# Patient Record
Sex: Male | Born: 1995 | Race: White | Hispanic: No | Marital: Married | Smoking: Former smoker
Health system: Southern US, Community
[De-identification: ages and names within clinical notes are randomized; demographics above are authoritative.]

## PROBLEM LIST (undated history)

## (undated) DIAGNOSIS — R079 Chest pain, unspecified: Secondary | ICD-10-CM

## (undated) DIAGNOSIS — E119 Type 2 diabetes mellitus without complications: Secondary | ICD-10-CM

## (undated) DIAGNOSIS — R61 Generalized hyperhidrosis: Secondary | ICD-10-CM

## (undated) DIAGNOSIS — R0602 Shortness of breath: Secondary | ICD-10-CM

## (undated) DIAGNOSIS — F419 Anxiety disorder, unspecified: Secondary | ICD-10-CM

## (undated) DIAGNOSIS — F17200 Nicotine dependence, unspecified, uncomplicated: Secondary | ICD-10-CM

## (undated) DIAGNOSIS — I10 Essential (primary) hypertension: Secondary | ICD-10-CM

## (undated) DIAGNOSIS — R55 Syncope and collapse: Secondary | ICD-10-CM

## (undated) DIAGNOSIS — R110 Nausea: Secondary | ICD-10-CM

## (undated) DIAGNOSIS — L600 Ingrowing nail: Secondary | ICD-10-CM

## (undated) DIAGNOSIS — R42 Dizziness and giddiness: Secondary | ICD-10-CM

## (undated) DIAGNOSIS — J309 Allergic rhinitis, unspecified: Secondary | ICD-10-CM

## (undated) DIAGNOSIS — F909 Attention-deficit hyperactivity disorder, unspecified type: Secondary | ICD-10-CM

## (undated) HISTORY — PX: OTHER SURGICAL HISTORY: SHX169

## (undated) HISTORY — DX: Allergic rhinitis, unspecified: J30.9

## (undated) HISTORY — DX: Shortness of breath: R06.02

## (undated) HISTORY — DX: Generalized hyperhidrosis: R61

## (undated) HISTORY — DX: Nausea: R11.0

## (undated) HISTORY — DX: Dizziness and giddiness: R42

## (undated) HISTORY — DX: Ingrowing nail: L60.0

## (undated) HISTORY — DX: Nicotine dependence, unspecified, uncomplicated: F17.200

## (undated) HISTORY — DX: Chest pain, unspecified: R07.9

## (undated) HISTORY — DX: Syncope and collapse: R55

---
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
## 2013-02-02 DIAGNOSIS — L600 Ingrowing nail: Secondary | ICD-10-CM | POA: Insufficient documentation

## 2015-10-01 ENCOUNTER — Ambulatory Visit (HOSPITAL_COMMUNITY)
Admission: EM | Admit: 2015-10-01 | Discharge: 2015-10-01 | Disposition: A | Discharge status: Home / Self Care | Payer: BLUE CROSS/BLUE SHIELD

## 2015-10-01 NOTE — ED Notes (Signed)
Pt   Was    Observed  By  Staff  Leaving  The   Dept

## 2018-01-24 ENCOUNTER — Encounter (HOSPITAL_COMMUNITY): Payer: Self-pay | Admitting: Nurse Practitioner

## 2018-01-24 ENCOUNTER — Emergency Department (HOSPITAL_COMMUNITY)
Admission: EM | Admit: 2018-01-24 | Discharge: 2018-01-24 | Disposition: A | Discharge status: Home / Self Care | Payer: No Typology Code available for payment source | Attending: Emergency Medicine | Admitting: Emergency Medicine

## 2018-01-24 ENCOUNTER — Emergency Department (HOSPITAL_COMMUNITY): Payer: No Typology Code available for payment source

## 2018-01-24 DIAGNOSIS — Z79899 Other long term (current) drug therapy: Secondary | ICD-10-CM | POA: Diagnosis not present

## 2018-01-24 DIAGNOSIS — M25512 Pain in left shoulder: Secondary | ICD-10-CM | POA: Diagnosis not present

## 2018-01-24 DIAGNOSIS — S199XXA Unspecified injury of neck, initial encounter: Secondary | ICD-10-CM | POA: Diagnosis present

## 2018-01-24 DIAGNOSIS — Y92410 Unspecified street and highway as the place of occurrence of the external cause: Secondary | ICD-10-CM | POA: Insufficient documentation

## 2018-01-24 DIAGNOSIS — S139XXA Sprain of joints and ligaments of unspecified parts of neck, initial encounter: Secondary | ICD-10-CM | POA: Diagnosis not present

## 2018-01-24 DIAGNOSIS — Y9389 Activity, other specified: Secondary | ICD-10-CM | POA: Insufficient documentation

## 2018-01-24 DIAGNOSIS — Y999 Unspecified external cause status: Secondary | ICD-10-CM | POA: Diagnosis not present

## 2018-01-24 MED ORDER — METHOCARBAMOL 500 MG PO TABS: 500.0000 mg | ORAL_TABLET | Freq: Two times a day (BID) | ORAL | 0 refills | Status: DC

## 2018-01-24 MED ORDER — NAPROXEN 500 MG PO TABS: 500.0000 mg | ORAL_TABLET | Freq: Two times a day (BID) | ORAL | 0 refills | Status: DC

## 2018-01-24 MED ORDER — IBUPROFEN 200 MG PO TABS
600.0000 mg | ORAL_TABLET | Freq: Once | ORAL | Status: AC
  Administered 2018-01-24: 600 mg via ORAL
  Filled 2018-01-24: qty 3

## 2018-01-24 MED ORDER — CYCLOBENZAPRINE HCL 10 MG PO TABS
10.0000 mg | ORAL_TABLET | Freq: Once | ORAL | Status: AC
  Administered 2018-01-24: 10 mg via ORAL
  Filled 2018-01-24: qty 1
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
## 2018-07-26 ENCOUNTER — Emergency Department (HOSPITAL_COMMUNITY)
Admission: EM | Admit: 2018-07-26 | Discharge: 2018-07-26 | Disposition: A | Discharge status: Home / Self Care | Payer: 59 | Attending: Emergency Medicine | Admitting: Emergency Medicine

## 2018-07-26 ENCOUNTER — Encounter (HOSPITAL_COMMUNITY): Payer: Self-pay | Admitting: Emergency Medicine

## 2018-07-26 ENCOUNTER — Emergency Department (HOSPITAL_COMMUNITY): Payer: 59

## 2018-07-26 ENCOUNTER — Other Ambulatory Visit: Payer: Self-pay

## 2018-07-26 DIAGNOSIS — Z79899 Other long term (current) drug therapy: Secondary | ICD-10-CM | POA: Insufficient documentation

## 2018-07-26 DIAGNOSIS — I10 Essential (primary) hypertension: Secondary | ICD-10-CM | POA: Diagnosis not present

## 2018-07-26 DIAGNOSIS — R55 Syncope and collapse: Secondary | ICD-10-CM

## 2018-07-26 DIAGNOSIS — R079 Chest pain, unspecified: Secondary | ICD-10-CM | POA: Insufficient documentation

## 2018-07-26 DIAGNOSIS — F17210 Nicotine dependence, cigarettes, uncomplicated: Secondary | ICD-10-CM | POA: Insufficient documentation

## 2018-07-26 HISTORY — DX: Essential (primary) hypertension: I10

## 2018-07-26 LAB — CBC
HCT: 45.3 % (ref 39.0–52.0)
Hemoglobin: 15.4 g/dL (ref 13.0–17.0)
MCH: 29.3 pg (ref 26.0–34.0)
MCHC: 34 g/dL (ref 30.0–36.0)
MCV: 86.1 fL (ref 80.0–100.0)
Platelets: 196 10*3/uL (ref 150–400)
RBC: 5.26 MIL/uL (ref 4.22–5.81)
RDW: 13.3 % (ref 11.5–15.5)
WBC: 6.9 10*3/uL (ref 4.0–10.5)
nRBC: 0 % (ref 0.0–0.2)

## 2018-07-26 LAB — BASIC METABOLIC PANEL WITH GFR
Anion gap: 12 (ref 5–15)
BUN: 13 mg/dL (ref 6–20)
CO2: 23 mmol/L (ref 22–32)
Calcium: 9.5 mg/dL (ref 8.9–10.3)
Chloride: 102 mmol/L (ref 98–111)
Creatinine, Ser: 1.12 mg/dL (ref 0.61–1.24)
GFR calc Af Amer: >60 mL/min
GFR calc non Af Amer: >60 mL/min
Glucose, Bld: 185 mg/dL — ABNORMAL HIGH (ref 70–99)
Potassium: 4.2 mmol/L (ref 3.5–5.1)
Sodium: 137 mmol/L (ref 135–145)

## 2018-07-26 LAB — TROPONIN I
Troponin I: <0.03 ng/mL (ref <0.03)
Troponin I: <0.03 ng/mL (ref <0.03)

## 2018-07-26 MED ORDER — SODIUM CHLORIDE 0.9% FLUSH
3.0000 mL | Freq: Once | INTRAVENOUS | Status: AC
  Administered 2018-07-26: 3 mL via INTRAVENOUS

## 2018-07-26 NOTE — ED Notes (Signed)
Returned from xray

## 2018-07-26 NOTE — ED Triage Notes (Signed)
Co sharp chest pain, SOB, dizziness, and nausea since midnight.  Reports syncopal episode lasting approx 30 seconds.

## 2018-07-26 NOTE — Discharge Instructions (Addendum)
Please call for an appointment for further evaluation at [facility]. Please return to the emergency department with any new or concerning symptoms.

## 2018-07-26 NOTE — ED Notes (Signed)
Patient transported to X-ray 

## 2018-07-26 NOTE — ED Provider Notes (Signed)
[facility] Provider [facility]   CSN: [id number] Arrival date & time: 07/26/18  0143    History   Chief Complaint Chief Complaint  Patient presents with  . Chest Pain  . Dizziness  . Loss of Consciousness  . Shortness of Breath    HPI Miguel SarnaDavid S Revard is a 23 y.o. male.     Patient with history of hypertension, smoker, presents to ED after syncopal episode around midnight tonight. He reports having chest pain prior to the syncope that continued after he regained consciousness and was associated with SOB, nausea, sweating. He has been having similar episodes of chest pain over the last several weeks without syncope, also associated with SOB, sweating and nausea, that last about 1 minute before they resolve. No identified aggravating factors, but he reports that activity seems to help the pain get better. No cough, fever.   The history is provided by the patient. No language interpreter was used.  Chest Pain  Associated symptoms: diaphoresis, dizziness, nausea, shortness of breath and syncope   Associated symptoms: no abdominal pain, no cough, no fever and no vomiting   Dizziness  Associated symptoms: chest pain, nausea, shortness of breath and syncope   Associated symptoms: no vomiting   Loss of Consciousness  Associated symptoms: chest pain, diaphoresis, dizziness, nausea and shortness of breath   Associated symptoms: no fever and no vomiting   Shortness of Breath  Associated symptoms: chest pain, diaphoresis and syncope   Associated symptoms: no abdominal pain, no cough, no fever and no vomiting     Past Medical History:  Diagnosis Date  . Hypertension     Patient Active Problem List   Diagnosis Date Noted  . Ingrown left big toenail 02/02/2013  . Allergic rhinitis 08/29/2012    Past Surgical History:  Procedure Laterality Date  . arm surgery          Home Medications    Prior to Admission medications   Medication Sig  Start Date End Date Taking? Authorizing Provider  losartan (COZAAR) 25 MG tablet Take by mouth. 11/26/17   [provider]  losartan (COZAAR) 25 MG tablet Take 25 mg by mouth daily. 11/26/17   [provider]  methocarbamol (ROBAXIN) 500 MG tablet Take 1 tablet (500 mg total) by mouth 2 (two) times daily. 01/24/18   Henderly, Britni A, PA-C  naproxen (NAPROSYN) 500 MG tablet Take 1 tablet (500 mg total) by mouth 2 (two) times daily. 01/24/18   Henderly, Britni A, PA-C    Family History No family history on file.  Social History Social History   Tobacco Use  . Smoking status: Current Every Day Smoker  . Smokeless tobacco: Never Used  Substance Use Topics  . Alcohol use: Yes  . Drug use: Not Currently     Allergies   Penicillin g   Review of Systems Review of Systems  Constitutional: Positive for diaphoresis. Negative for fever.  HENT: Negative.   Respiratory: Positive for shortness of breath. Negative for cough.   Cardiovascular: Positive for chest pain and syncope. Negative for leg swelling.  Gastrointestinal: Positive for nausea. Negative for abdominal pain and vomiting.  Musculoskeletal: Negative.   Skin: Negative for color change.  Neurological: Positive for dizziness and syncope.     Physical Exam Updated Vital Signs BP (!) 153/99   Pulse 84   Temp 98.3 F (36.8 C) (Oral)   Resp (!) 23   Ht 6\' 5"  (1.956 m)   Wt Marland Kitchen(!)  142.9 kg   SpO2 97%   BMI 37.35 kg/m   Physical Exam Vitals signs and nursing note reviewed.  Constitutional:      Appearance: He is well-developed.  HENT:     Head: Normocephalic.  Neck:     Musculoskeletal: Normal range of motion and neck supple.  Cardiovascular:     Rate and Rhythm: Normal rate and regular rhythm.  Pulmonary:     Effort: Pulmonary effort is normal. No respiratory distress.     Breath sounds: Normal breath sounds. No wheezing, rhonchi or rales.  Chest:     Chest wall: Tenderness (Mild chest wall  tenderness. ) present.  Abdominal:     General: Bowel sounds are normal.     Palpations: Abdomen is soft.     Tenderness: There is no abdominal tenderness. There is no guarding or rebound.  Musculoskeletal: Normal range of motion.     Right lower leg: No edema.     Left lower leg: No edema.  Skin:    General: Skin is warm and dry.     Findings: No rash.  Neurological:     Mental Status: He is alert and oriented to person, place, and time.      ED Treatments / Results  Labs (all labs ordered are listed, but only abnormal results are displayed) Labs Reviewed  BASIC METABOLIC PANEL - Abnormal; Notable for the following components:      Result Value   Glucose, Bld 185 (*)    All other components within normal limits  CBC  TROPONIN I    EKG EKG Interpretation  Date/Time:  Saturday July 26 2018 01:54:54 EDT Ventricular Rate:  93 PR Interval:  156 QRS Duration: 84 QT Interval:  344 QTC Calculation: 427 R Axis:   72 Text Interpretation:  Normal sinus rhythm Normal ECG No old tracing to compare Confirmed by Dione Booze ([id number]) on 07/26/2018 2:04:46 AM   Radiology Dg Chest 2 View  Result Date: 07/26/2018 CLINICAL DATA:  Chest pain, shortness of breath and dizziness. EXAM: CHEST - 2 VIEW COMPARISON:  None. FINDINGS: The cardiomediastinal contours are normal. The lungs are clear. Pulmonary vasculature is normal. No consolidation, pleural effusion, or pneumothorax. No acute osseous abnormalities are seen. IMPRESSION: Negative radiographs of the chest. Electronically Signed   By: Narda Rutherford M.D.   On: 07/26/2018 02:41    Procedures Procedures (including critical care time)  Medications Ordered in ED Medications  sodium chloride flush (NS) 0.9 % injection 3 mL (has no administration in time range)     Initial Impression / Assessment and Plan / ED Course  I have reviewed the triage vital signs and the nursing notes.  Pertinent labs & imaging results that were  available during my care of the patient were reviewed by me and considered in my medical decision making (see chart for details).        Patient to ED with chest pain tonight followed by syncopal episode. He has been having episodes of chest pain intermittently over the last several weeks associated with SOB, nausea, sweating.   Patient with a low Heart Score of 2. EKG without concerning changes. Troponin x 2 negative. CXR clear. He has been asymptomatic in the ED for the duration of encounter.   He is seen and evaluated by Dr. Bebe Shaggy and is felt appropriate for discharge home. Will refer to cardiology for further evaluation of chest pain and single syncopal episode.       Final Clinical Impressions(s) /  ED Diagnoses   Final diagnoses:  None   1. Nonspecific chest pain 2. Syncope  ED Discharge Orders    None       Elpidio Anis, Cordelia Poche 07/26/18 [id number]    Zadie Rhine, MD 07/26/18 0700

## 2018-07-26 NOTE — ED Provider Notes (Signed)
Patient seen/examined in the Emergency Department in conjunction with Advanced Practice Provider [place] Patient reports chest pain and syncopal episode Exam : awake/alert, mildly anxious No loud murmurs, no added lung sounds Plan: will need repeat troponin Would recommend cardiology referral Low suspicion for PE/Dissection    Zadie Rhine, MD 07/26/18 [phone]

## 2018-07-29 ENCOUNTER — Telehealth: Payer: Self-pay

## 2018-07-29 NOTE — Telephone Encounter (Deleted)
Virtual Visit Pre-Appointment Phone Call  Steps For Call:  1. Confirm consent - "In the setting of the current Covid19 crisis, you are scheduled for a (phone or video) visit with your provider on (date) at (time).  Just as we do with many in-office visits, in order for you to participate in this visit, we must obtain consent.  If you'd like, I can send this to your mychart (if signed up) or email for you to review.  Otherwise, I can obtain your verbal consent now.  All virtual visits are billed to your insurance company just like a normal visit would be.  By agreeing to a virtual visit, we'd like you to understand that the technology does not allow for your provider to perform an examination, and thus may limit your provider's ability to fully assess your condition.  Finally, though the technology is pretty good, we cannot assure that it will always work on either your or our end, and in the setting of a video visit, we may have to convert it to a phone-only visit.  In either situation, we cannot ensure that we have a secure connection.  Are you willing to proceed?" STAFF: Did the patient verbally acknowledge consent to telehealth visit? ***Document YES/NO: YES  2. Confirm the BEST phone number to call the day of the visit: [phone]  3. Give patient instructions for WebEx/MyChart download to smartphone as below or Doximity/Doxy.me if video visit (depending on what platform provider is using)  4. Advise patient to be prepared with any vital sign or heart rhythm information, their current medicines, and a piece of paper and pen handy for any instructions they may receive the day of their visit  5. Inform patient they will receive a phone call 15 minutes prior to their appointment time (may be from unknown caller ID) so they should be prepared to answer  6. Confirm that appointment type is correct in Epic appointment notes (video vs telephone)     TELEPHONE CALL NOTE  Miguel Sims has  been deemed a candidate for a follow-up tele-health visit to limit community exposure during the Covid-19 pandemic. I spoke with the patient via phone to ensure availability of phone/video source, confirm preferred email & phone number, and discuss instructions and expectations.  I reminded Miguel SarnaDavid S Sims to be prepared with any vital sign and/or heart rhythm information that could potentially be obtained via home monitoring, at the time of his visit. I reminded Miguel Sims to expect a phone call at the time of his visit if his visit.  Michaelle Copasatasha  Leiland Mihelich, CMA 07/29/2018 12:45 PM   DOWNLOADING THE MYCHART APP TO SMARTPHONE  - If Apple, go to [employer] Store and type in MyChart in the search bar and download the app. If Android, ask patient to go to [employer] Play Store and type in [place] in the search bar and download the app. The app is free but as with any other app downloads, their phone may require them to verify saved payment information or Apple/Android password.  - The patient will need to then log into the app with their MyChart username and password, and select [facility] as their healthcare provider to link the account. When it is time for your visit, go to the MyChart app, find appointments, and click Begin Video Visit. Be sure to Select Allow for your device to access the Microphone and Camera for your visit. You will then be connected, and your provider will be  with you shortly.  **If they have any issues connecting, or need assistance please contact MyChart service desk [phone])**  **If using a computer, in order to ensure the best quality for your visit they will need to use either of the following Internet Browsers: Microsoft [place], or Google Chrome**  FULL LENGTH CONSENT FOR TELE-HEALTH VISIT   I hereby voluntarily request, consent and authorize [facility] and its employed or contracted physicians, physician assistants, nurse practitioners or other licensed health care  professionals (the Practitioner), to provide me with telemedicine health care services (the "Services") as deemed necessary by the treating Practitioner. I acknowledge and consent to receive the Services by the Practitioner via telemedicine. I understand that the telemedicine visit will involve communicating with the Practitioner through live audiovisual communication technology and the disclosure of certain medical information by electronic transmission. I acknowledge that I have been given the opportunity to request an in-person assessment or other available alternative prior to the telemedicine visit and am voluntarily participating in the telemedicine visit.  I understand that I have the right to withhold or withdraw my consent to the use of telemedicine in the course of my care at any time, without affecting my right to future care or treatment, and that the Practitioner or I may terminate the telemedicine visit at any time. I understand that I have the right to inspect all information obtained and/or recorded in the course of the telemedicine visit and may receive copies of available information for a reasonable fee.  I understand that some of the potential risks of receiving the Services via telemedicine include:  Marland Kitchen Delay or interruption in medical evaluation due to technological equipment failure or disruption; . Information transmitted may not be sufficient (e.g. poor resolution of images) to allow for appropriate medical decision making by the Practitioner; and/or  . In rare instances, security protocols could fail, causing a breach of personal health information.  Furthermore, I acknowledge that it is my responsibility to provide information about my medical history, conditions and care that is complete and accurate to the best of my ability. I acknowledge that Practitioner's advice, recommendations, and/or decision may be based on factors not within their control, such as incomplete or inaccurate  data provided by me or distortions of diagnostic images or specimens that may result from electronic transmissions. I understand that the practice of medicine is not an exact science and that Practitioner makes no warranties or guarantees regarding treatment outcomes. I acknowledge that I will receive a copy of this consent concurrently upon execution via email to the email address I last provided but may also request a printed copy by calling the office of [facility].    I understand that my insurance will be billed for this visit.   I have read or had this consent read to me. . I understand the contents of this consent, which adequately explains the benefits and risks of the Services being provided via telemedicine.  . I have been provided ample opportunity to ask questions regarding this consent and the Services and have had my questions answered to my satisfaction. . I give my informed consent for the services to be provided through the use of telemedicine in my medical care  By participating in this telemedicine visit I agree to the above.

## 2018-07-29 NOTE — Telephone Encounter (Signed)
Virtual Visit Pre-Appointment Phone Call  Steps For Call:  Confirm consent - "In the setting of the current Covid19 crisis, you are scheduled for a (phone or video) visit with your provider on (date) at (time).  Just as we do with many in-office visits, in order for you to participate in this visit, we must obtain consent.  If you'd like, I can send this to your mychart (if signed up) or email for you to review.  Otherwise, I can obtain your verbal consent now.  All virtual visits are billed to your insurance company just like a normal visit would be.  By agreeing to a virtual visit, we'd like you to understand that the technology does not allow for your provider to perform an examination, and thus may limit your provider's ability to fully assess your condition.  Finally, though the technology is pretty good, we cannot assure that it will always work on either your or our end, and in the setting of a video visit, we may have to convert it to a phone-only visit.  In either situation, we cannot ensure that we have a secure connection.  Are you willing to proceed?" STAFF: Did the patient verbally acknowledge consent to telehealth visit? YES  1. Confirm the BEST phone number to call the day of the visit: [phone]  2. Give patient instructions for WebEx/MyChart download to smartphone as below or Doximity/Doxy.me if video visit (depending on what platform provider is using)  3. Advise patient to be prepared with any vital sign or heart rhythm information, their current medicines, and a piece of paper and pen handy for any instructions they may receive the day of their visit  4. Inform patient they will receive a phone call 15 minutes prior to their appointment time (may be from unknown caller ID) so they should be prepared to answer  5. Confirm that appointment type is correct in Epic appointment notes (video vs telephone)     TELEPHONE CALL NOTE  SEYMOUR BENNETTS has been deemed a candidate  for a follow-up tele-health visit to limit community exposure during the Covid-19 pandemic. I spoke with the patient via phone to ensure availability of phone/video source, confirm preferred email & phone number, and discuss instructions and expectations.  I reminded RYNA SCARRY to be prepared with any vital sign and/or heart rhythm information that could potentially be obtained via home monitoring, at the time of his visit. I reminded DOYCE LANGTON to expect a phone call at the time of his visit if his visit.  Michaelle Copas, CMA 07/29/2018 12:56 PM  FULL LENGTH CONSENT FOR TELE-HEALTH VISIT   I hereby voluntarily request, consent and authorize [facility] and its employed or contracted physicians, physician assistants, nurse practitioners or other licensed health care professionals (the Practitioner), to provide me with telemedicine health care services (the Services") as deemed necessary by the treating Practitioner. I acknowledge and consent to receive the Services by the Practitioner via telemedicine. I understand that the telemedicine visit will involve communicating with the Practitioner through live audiovisual communication technology and the disclosure of certain medical information by electronic transmission. I acknowledge that I have been given the opportunity to request an in-person assessment or other available alternative prior to the telemedicine visit and am voluntarily participating in the telemedicine visit.  I understand that I have the right to withhold or withdraw my consent to the use of telemedicine in the course of my care at any time, without  affecting my right to future care or treatment, and that the Practitioner or I may terminate the telemedicine visit at any time. I understand that I have the right to inspect all information obtained and/or recorded in the course of the telemedicine visit and may receive copies of available information for a reasonable fee.  I  understand that some of the potential risks of receiving the Services via telemedicine include:   Delay or interruption in medical evaluation due to technological equipment failure or disruption;  Information transmitted may not be sufficient (e.g. poor resolution of images) to allow for appropriate medical decision making by the Practitioner; and/or   In rare instances, security protocols could fail, causing a breach of personal health information.  Furthermore, I acknowledge that it is my responsibility to provide information about my medical history, conditions and care that is complete and accurate to the best of my ability. I acknowledge that Practitioner's advice, recommendations, and/or decision may be based on factors not within their control, such as incomplete or inaccurate data provided by me or distortions of diagnostic images or specimens that may result from electronic transmissions. I understand that the practice of medicine is not an exact science and that Practitioner makes no warranties or guarantees regarding treatment outcomes. I acknowledge that I will receive a copy of this consent concurrently upon execution via email to the email address I last provided but may also request a printed copy by calling the office of [facility].    I understand that my insurance will be billed for this visit.   I have read or had this consent read to me.  I understand the contents of this consent, which adequately explains the benefits and risks of the Services being provided via telemedicine.   I have been provided ample opportunity to ask questions regarding this consent and the Services and have had my questions answered to my satisfaction.  I give my informed consent for the services to be provided through the use of telemedicine in my medical care  By participating in this telemedicine visit I agree to the above.

## 2018-07-30 NOTE — Progress Notes (Signed)
Virtual Visit via Video Note   This visit type was conducted due to national recommendations for restrictions regarding the COVID-19 Pandemic (e.g. social distancing) in an effort to limit this patient's exposure and mitigate transmission in our community.  Due to his co-morbid illnesses, this patient is at least at moderate risk for complications without adequate follow up.  This format is felt to be most appropriate for this patient at this time.  All issues noted in this document were discussed and addressed.  A limited physical exam was performed with this format.  Please refer to the patient's chart for his consent to telehealth for [facility].   Evaluation Performed:  Follow-up visit  Date:  08/01/2018   ID:  Miguel Sims, DOB [birth year], MRN [id number]  Patient Location: Home Provider Location: Office  PCP:  Patient, No Pcp Per  Cardiologist:  [place] Electrophysiologist:  None   Chief Complaint:  Syncope / Dyspnea   History of Present Illness:    23 y.o. new patient referred by Dr Roger Shelter ER for syncope and dyspnea. Reviewed ER note from 07/26/18 Smoker with HTN. Around midnight 4/11 had chest pain, dyspnea, nausea and diaphoresis Going on for a week without syncope Last about a minute In ER BP 153/99 pulse 84 Had pain to palpation in ER Negative troponin x 2 normal ECG CXR NAD Labs normal except for BS 185  Grandfather had premature CAD ? Father as well Two older sisters with no heart issues. Primary [facility] in [place]. 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 Wife witnessed LOC no seizure activity or incontinence Did not hurt himself  Does welding for a living Originally from [place]   The patient does not have symptoms concerning for COVID-19 infection (fever, chills, cough, or new shortness of breath).    Past Medical History:  Diagnosis Date  . Allergic rhinitis   . Chest pain   . Diaphoresis   . Dizziness    . Hypertension   . Hypertension   . Ingrown nail of great toe of left foot   . Nausea   . Smoker   . SOB (shortness of breath)   . Syncope and collapse    Past Surgical History:  Procedure Laterality Date  . arm surgery       Current Meds  Medication Sig  . losartan (COZAAR) 25 MG tablet Take 25 mg by mouth daily.     Allergies:   Penicillin g   Social History   Tobacco Use  . Smoking status: Current Every Day Smoker  . Smokeless tobacco: Never Used  Substance Use Topics  . Alcohol use: Yes  . Drug use: Not Currently     Family Hx: The patient's family history is not on file.  ROS:   Please see the history of present illness.     All other systems reviewed and are negative.   Prior CV studies:   The following studies were reviewed today:  ER labs, CXR, ECG  Labs/Other Tests and Data Reviewed:    EKG:  SR Rate 93 normal 07/28/18  Recent Labs: 07/26/2018: BUN 13; Creatinine, Ser 1.12; Hemoglobin 15.4; Platelets 196; Potassium 4.2; Sodium 137   Recent Lipid Panel No results found for: CHOL, TRIG, HDL, CHOLHDL, LDLCALC, LDLDIRECT  Wt Readings from Last 3 Encounters:  08/01/18 (!) 139.7 kg  07/26/18 (!) 142.9 kg     Objective:    Vital  Signs:  Ht 6\' 6"  (1.981 m)   Wt (!) 139.7 kg   BMI 35.59 kg/m    Well nourished, well developed male in no acute distress. Skin warm and dry No JVP elevation No tachypnea No edema  ASSESSMENT & PLAN:    1. Syncope:  No high risk family history, normal ECG, normal CXR Low likelyhood of cardiac etiology D dimer not done. Ideally would have echo and cardiac CT for definitive to r/o structural heart issue. Telemetry normal in ER willl try to arrange testing in 4 weeks Encouraged him to f/u with primary and arrange neurology f/u given history of childhood seizures needs at minimum EEG 2. HTN:  Continue losartan 3. Chest Pain :  R/o normal ECG cannot do ETT during COVID 19 feel most important to r/o PE given syncope and  dyspnea see above regarding cardiac CT 4. DM;  ?? F/u [facility] for A1c discussed low carb diet   COVID-19 Education: The signs and symptoms of COVID-19 were discussed with the patient and how to seek care for testing (follow up with PCP or arrange E-visit).  The importance of social distancing was discussed today.  Time:   Today, I have spent 30 minutes with the patient with telehealth technology discussing the above problems.     Medication Adjustments/Labs and Tests Ordered: Current medicines are reviewed at length with the patient today.  Concerns regarding medicines are outlined above.   Tests Ordered: No orders of the defined types were placed in this encounter.   Medication Changes: No orders of the defined types were placed in this encounter.   Disposition:  Follow up PRN   Signed, Charlton HawsPeter Korvin Valentine, MD  08/01/2018 9:27 AM    [facility]

## 2018-08-01 ENCOUNTER — Encounter: Payer: Self-pay | Admitting: Cardiovascular Disease

## 2018-08-01 ENCOUNTER — Telehealth (INDEPENDENT_AMBULATORY_CARE_PROVIDER_SITE_OTHER): Payer: 59 | Admitting: Cardiovascular Disease

## 2018-08-01 ENCOUNTER — Other Ambulatory Visit: Payer: Self-pay

## 2018-08-01 VITALS — Ht 78.0 in | Wt 308.0 lb

## 2018-08-01 DIAGNOSIS — R55 Syncope and collapse: Secondary | ICD-10-CM

## 2018-08-01 MED ORDER — METOPROLOL TARTRATE 50 MG PO TABS: ORAL_TABLET | ORAL | 0 refills | Status: AC

## 2018-08-01 NOTE — Patient Instructions (Addendum)
Medication Instructions:   If you need a refill on your cardiac medications before your next appointment, please call your pharmacy.   Lab work:  If you have labs (blood work) drawn today and your tests are completely normal, you will receive your results only by: Marland Kitchen MyChart Message (if you have MyChart) OR . A paper copy in the mail If you have any lab test that is abnormal or we need to change your treatment, we will call you to review the results.  Testing/Procedures: Your physician has requested that you have cardiac CT in 4 weeks. Cardiac computed tomography (CT) is a painless test that uses an x-ray machine to take clear, detailed pictures of your heart. For further information please visit [email or web address] Please follow instruction sheet as given.   Your physician has requested that you have an echocardiogram in 4 weeks. Echocardiography is a painless test that uses sound waves to create images of your heart. It provides your doctor with information about the size and shape of your heart and how well your heart's chambers and valves are working. This procedure takes approximately one hour. There are no restrictions for this procedure.  Follow-Up: At [facility], you and your health needs are our priority.  As part of our continuing mission to provide you with exceptional heart care, we have created designated Provider Care Teams.  These Care Teams include your primary Cardiologist (physician) and Advanced Practice Providers (APPs -  Physician Assistants and Nurse Practitioners) who all work together to provide you with the care you need, when you need it. You will need a follow up appointment in 3 months. You may see Dr. Eden Emms or one of the following Advanced Practice Providers on your designated Care Team:   Norma Fredrickson, NP Nada Boozer, NP . Georgie Chard, NP  Someone from our office will call you to schedule appointments.   Please arrive at the [facility] main entrance  of [facility] at xx:xx AM (30-45 minutes prior to test start time)  [facility] [address] [place], [place] [address] [phone]  Proceed to the [facility] Radiology Department (First Floor).  Please follow these instructions carefully (unless otherwise directed):  Hold all erectile dysfunction medications at least 48 hours prior to test.  On the Night Before the Test: . Be sure to Drink plenty of water. . Do not consume any caffeinated/decaffeinated beverages or chocolate 12 hours prior to your test. . Do not take any antihistamines 12 hours prior to your test. . Take Metoprolol (Lopressor) 50 mg the night before your test   On the Day of the Test: . Drink plenty of water. Do not drink any water within one hour of the test. . Do not eat any food 4 hours prior to the test. . You may take your regular medications prior to the test.  . Take metoprolol (Lopressor) 50 mg  two hours prior to test.      After the Test: . Drink plenty of water. . After receiving IV contrast, you may experience a mild flushed feeling. This is normal. . On occasion, you may experience a mild rash up to 24 hours after the test. This is not dangerous. If this occurs, you can take Benadryl 25 mg and increase your fluid intake. . If you experience trouble breathing, this can be serious. If it is severe call 911 IMMEDIATELY. If it is mild, please call our office.

## 2018-08-11 ENCOUNTER — Telehealth: Payer: Self-pay | Admitting: *Deleted

## 2018-08-11 ENCOUNTER — Encounter: Payer: Self-pay | Admitting: *Deleted

## 2018-08-11 ENCOUNTER — Other Ambulatory Visit: Payer: Self-pay | Admitting: Family Medicine

## 2018-08-11 DIAGNOSIS — R7989 Other specified abnormal findings of blood chemistry: Secondary | ICD-10-CM

## 2018-08-11 DIAGNOSIS — R945 Abnormal results of liver function studies: Principal | ICD-10-CM

## 2018-08-11 NOTE — Telephone Encounter (Signed)
Spoke with patient and updated EMR prior to video visit on Wed.

## 2018-08-13 ENCOUNTER — Ambulatory Visit (INDEPENDENT_AMBULATORY_CARE_PROVIDER_SITE_OTHER): Payer: 59 | Admitting: Diagnostic Neuroimaging

## 2018-08-13 ENCOUNTER — Other Ambulatory Visit: Payer: Self-pay

## 2018-08-13 ENCOUNTER — Encounter: Payer: Self-pay | Admitting: Diagnostic Neuroimaging

## 2018-08-13 DIAGNOSIS — R55 Syncope and collapse: Secondary | ICD-10-CM

## 2018-08-13 NOTE — Progress Notes (Addendum)
[facility]  PATIENT: Miguel Sims DOB: April 26, 1995  REFERRING CLINICIAN: Terri PiedraK Kaplan HISTORY FROM: patient  REASON FOR VISIT: new consult    HISTORICAL  CHIEF COMPLAINT:  Chief Complaint  Patient presents with  . Loss of Consciousness    HISTORY OF PRESENT ILLNESS:   23 year old male here for evaluation of syncope.  07/25/2026 patient was at home with family standing up in the kitchen when all of a sudden he had sudden onset of severe squeezing crushing chest pain and grabbed his chest and passed out.  He was unresponsive for 90 seconds.  He was pale, clammy.  No convulsions.  No tongue biting or incontinence.  When he woke up he was severely nauseated and short of breath. No specific other triggering factors.  EMS was called and patient's blood pressure at home was 220/110.  Patient went to the hospital for evaluation.  X-ray and troponins were negative.  Patient was referred to PCP and cardiology.  Patient now diagnosed with diabetes and on metformin.  Additional blood pressure medications have been optimized.  Patient does not smoke cigarettes but uses chewing tobacco.  At age 23 years old patient was on Ritalin, and had a single convulsion/seizure.  This was felt to be medication side effect.  Patient's medications were changed and he had no further seizures.  Patient was never on antiseizure medication.    REVIEW OF SYSTEMS: Full 14 system review of systems performed and negative with exception of: As per HPI.  ALLERGIES: Allergies  Allergen Reactions  . Penicillin G     Other reaction(s): Other (See Comments) Unknown reaction    HOME MEDICATIONS: Outpatient Medications Prior to Visit  Medication Sig Dispense Refill  . losartan (COZAAR) 25 MG tablet Take 25 mg by mouth daily.  5  . metoprolol tartrate (LOPRESSOR) 50 MG tablet Take one tablet by mouth the night before CT and take one tablet by mouth the day of your CT, 2 hours prior to the CT  (Patient not taking: Reported on 08/11/2018) 2 tablet 0   No facility-administered medications prior to visit.     PAST MEDICAL HISTORY: Past Medical History:  Diagnosis Date  . Allergic rhinitis   . Chest pain   . Diaphoresis   . Dizziness   . Hypertension   . Hypertension   . Ingrown nail of great toe of left foot   . Nausea   . Smoker   . SOB (shortness of breath)   . Syncope and collapse     PAST SURGICAL HISTORY: Past Surgical History:  Procedure Laterality Date  . arm surgery      FAMILY HISTORY: Family History  Problem Relation Age of Onset  . Heart disease Father     SOCIAL HISTORY: Social History   Socioeconomic History  . Marital status: Married    Spouse name: Not on file  . Number of children: 0  . Years of education: Not on file  . Highest education level: GED or equivalent  Occupational History  . Not on file  Social Needs  . Financial resource strain: Not on file  . Food insecurity:    Worry: Not on file    Inability: Not on file  . Transportation needs:    Medical: Not on file    Non-medical: Not on file  Tobacco Use  . Smoking status: Former Smoker    Last attempt to quit: 08/10/2017    Years since quitting: 1.0  . Smokeless tobacco: Current  User    Types: Chew, Snuff  Substance and Sexual Activity  . Alcohol use: Not Currently    Frequency: Never  . Drug use: Not Currently  . Sexual activity: Not on file  Lifestyle  . Physical activity:    Days per week: Not on file    Minutes per session: Not on file  . Stress: Not on file  Relationships  . Social connections:    Talks on phone: Not on file    Gets together: Not on file    Attends religious service: Not on file    Active member of club or organization: Not on file    Attends meetings of clubs or organizations: Not on file    Relationship status: Not on file  . Intimate partner violence:    Fear of current or ex partner: Not on file    Emotionally abused: Not on file     Physically abused: Not on file    Forced sexual activity: Not on file  Other Topics Concern  . Not on file  Social History Narrative   Lives with wife   Caffeine- none     PHYSICAL EXAM   VIDEO EXAM  GENERAL EXAM/CONSTITUTIONAL:  Vitals: There were no vitals filed for this visit.  There is no height or weight on file to calculate BMI. Wt Readings from Last 3 Encounters:  08/01/18 (!) 308 lb (139.7 kg)  07/26/18 (!) 315 lb (142.9 kg)     Patient is in no distress; well developed, nourished and groomed; neck is supple   NEUROLOGIC: MENTAL STATUS:  No flowsheet data found.  awake, alert, oriented to person, place and time  recent and remote memory intact  normal attention and concentration  language fluent, comprehension intact, naming intact  fund of knowledge appropriate  CRANIAL NERVE:   2nd, 3rd, 4th, 6th - visual fields full to confrontation, extraocular muscles intact, no nystagmus  5th - facial sensation symmetric  7th - facial strength symmetric  8th - hearing intact  11th - shoulder shrug symmetric  12th - tongue protrusion midline  MOTOR:   NO TREMOR; NO DRIFT IN BUE  SENSORY:   normal and symmetric to light touch  COORDINATION:   fine finger movements normal     DIAGNOSTIC DATA (LABS, IMAGING, TESTING) - I reviewed patient records, labs, notes, testing and imaging myself where available.  Lab Results  Component Value Date   WBC 6.9 07/26/2018   HGB 15.4 07/26/2018   HCT 45.3 07/26/2018   MCV 86.1 07/26/2018   PLT 196 07/26/2018      Component Value Date/Time   NA 137 07/26/2018 0156   K 4.2 07/26/2018 0156   CL 102 07/26/2018 0156   CO2 23 07/26/2018 0156   GLUCOSE 185 (H) 07/26/2018 0156   BUN 13 07/26/2018 0156   CREATININE 1.12 07/26/2018 0156   CALCIUM 9.5 07/26/2018 0156   GFRNONAA >60 07/26/2018 0156   GFRAA >60 07/26/2018 0156   No results found for: CHOL, HDL, LDLCALC, LDLDIRECT, TRIG, CHOLHDL No results  found for: HGBA1C No results found for: VITAMINB12 No results found for: TSH     ASSESSMENT AND PLAN  23 y.o. year old male here with new onset severe chest pain, shortness of breath, syncope.  History of single medication induced seizure at age 23 years old related to Ritalin.  No other signs to suggest recent seizure activity.  Would recommend to follow-up with PCP and cardiology regarding cardiac and medical work-up of  syncope.   Dx:  1. Syncope, unspecified syncope type     Virtual Visit via Video Note  I connected with Miguel Sims on 08/20/18 at  4:00 PM EDT by a video enabled telemedicine application and verified that I am speaking with the correct person using two identifiers.  Location: Patient: home  Provider: office   I discussed the limitations of evaluation and management by telemedicine and the availability of in person appointments. The patient expressed understanding and agreed to proceed.  I discussed the assessment and treatment plan with the patient. The patient was provided an opportunity to ask questions and all were answered. The patient agreed with the plan and demonstrated an understanding of the instructions.   The patient was advised to call back or seek an in-person evaluation if the symptoms worsen or if the condition fails to improve as anticipated.  I provided 30 minutes of non-face-to-face time during this encounter.    PLAN:  UNPROVOKED CHEST PAIN --> SYNCOPE   - continue cardiac and medical workup  - low likelihood of seizure; will hold off on MRI brain and EEG for now   - According to [place] law, you can not drive unless you are syncope free for at least 6 months and under physician's care.   - Please maintain precautions. Do not participate in activities where a loss of awareness could harm you or someone else. No swimming alone, no tub bathing, no hot tubs, no driving, no operating motorized vehicles (cars, ATVs, motocycles, etc),  lawnmowers, power tools or firearms. No standing at heights, such as rooftops, ladders or stairs. Avoid hot objects such as stoves, heaters, open fires. Wear a helmet when riding a bicycle, scooter, skateboard, etc. and avoid areas of traffic. Set your water heater to 120 degrees or less.  Return for return to PCP, pending if symptoms worsen or fail to improve.    Suanne Marker, MD 08/13/2018, 3:50 PM Certified in Neurology, Neurophysiology and Neuroimaging  [facility] [address] [place], [place] [address] [phone]

## 2018-08-19 ENCOUNTER — Other Ambulatory Visit: Payer: 59

## 2018-08-22 ENCOUNTER — Ambulatory Visit (HOSPITAL_COMMUNITY): Payer: 59

## 2018-09-01 ENCOUNTER — Telehealth (HOSPITAL_COMMUNITY): Payer: Self-pay | Admitting: Emergency Medicine

## 2018-09-01 ENCOUNTER — Telehealth: Payer: Self-pay | Admitting: Cardiology

## 2018-09-01 NOTE — Telephone Encounter (Signed)
Left message on voicemail with name and callback number Wen Merced RN Navigator Cardiac Imaging [facility] [phone] Office [phone] Cell  

## 2018-09-01 NOTE — Telephone Encounter (Signed)
Left message to reschedule cardiac ct due to machine is down.

## 2018-09-02 ENCOUNTER — Ambulatory Visit (HOSPITAL_COMMUNITY): Payer: 59

## 2018-09-03 ENCOUNTER — Telehealth (HOSPITAL_COMMUNITY): Payer: Self-pay | Admitting: Emergency Medicine

## 2018-09-03 NOTE — Telephone Encounter (Signed)
Left message on voicemail with name and callback number Eudora Guevarra RN Navigator Cardiac Imaging [facility] [phone] Office [phone] Cell  

## 2018-09-04 ENCOUNTER — Ambulatory Visit (HOSPITAL_COMMUNITY): Payer: 59

## 2018-09-04 ENCOUNTER — Other Ambulatory Visit: Payer: Self-pay

## 2018-09-04 ENCOUNTER — Ambulatory Visit (HOSPITAL_COMMUNITY)
Admission: RE | Admit: 2018-09-04 | Discharge: 2018-09-04 | Disposition: A | Discharge status: Home / Self Care | Payer: 59 | Source: Ambulatory Visit | Attending: Cardiovascular Disease | Admitting: Cardiovascular Disease

## 2018-09-04 DIAGNOSIS — R55 Syncope and collapse: Secondary | ICD-10-CM | POA: Insufficient documentation

## 2018-09-04 MED ORDER — NITROGLYCERIN 0.4 MG SL SUBL
0.8000 mg | SUBLINGUAL_TABLET | Freq: Once | SUBLINGUAL | Status: AC
  Administered 2018-09-04: 0.8 mg via SUBLINGUAL
  Filled 2018-09-04: qty 25

## 2018-09-04 MED ORDER — IOHEXOL 350 MG/ML SOLN
100.0000 mL | Freq: Once | INTRAVENOUS | Status: AC | PRN
  Administered 2018-09-04: 100 mL via INTRAVENOUS

## 2018-09-04 MED ORDER — NITROGLYCERIN 0.4 MG SL SUBL
SUBLINGUAL_TABLET | SUBLINGUAL | Status: AC
  Filled 2018-09-04: qty 2

## 2018-09-05 NOTE — Telephone Encounter (Signed)
Patient had CT done yesterday.

## 2018-09-15 ENCOUNTER — Telehealth (HOSPITAL_COMMUNITY): Payer: Self-pay

## 2018-09-15 NOTE — Telephone Encounter (Signed)
LMTCB COVID prescreening for echo. 

## 2018-09-16 ENCOUNTER — Ambulatory Visit (HOSPITAL_COMMUNITY): Payer: 59 | Attending: Cardiovascular Disease

## 2018-09-16 ENCOUNTER — Other Ambulatory Visit: Payer: Self-pay

## 2018-09-16 DIAGNOSIS — R55 Syncope and collapse: Secondary | ICD-10-CM | POA: Diagnosis not present

## 2018-09-19 ENCOUNTER — Telehealth: Payer: Self-pay | Admitting: Cardiovascular Disease

## 2018-09-19 NOTE — Telephone Encounter (Signed)
Called patient back with echo results. 

## 2018-09-19 NOTE — Telephone Encounter (Signed)
F/U Message            Patient is retuning someone's call would like a call back

## 2018-09-19 NOTE — Telephone Encounter (Signed)
Follow up    Pts wife is returning call for results   Please call back

## 2018-10-20 ENCOUNTER — Telehealth: Payer: Self-pay

## 2018-10-20 NOTE — Telephone Encounter (Signed)
I have attempted to contact this patient by phone about appointment with Dr. Johnsie Cancel on 7/15 but patient mailbox has not been set up yet. Will try again later.

## 2018-10-29 ENCOUNTER — Telehealth: Payer: Self-pay | Admitting: Cardiovascular Disease

## 2019-02-18 ENCOUNTER — Other Ambulatory Visit: Payer: Self-pay

## 2019-02-18 DIAGNOSIS — Z20822 Contact with and (suspected) exposure to covid-19: Secondary | ICD-10-CM

## 2019-02-19 LAB — NOVEL CORONAVIRUS, NAA: SARS-CoV-2, NAA: NOT DETECTED

## 2019-03-03 ENCOUNTER — Other Ambulatory Visit: Payer: Self-pay | Admitting: *Deleted

## 2019-03-03 DIAGNOSIS — Z20822 Contact with and (suspected) exposure to covid-19: Secondary | ICD-10-CM

## 2019-03-05 LAB — NOVEL CORONAVIRUS, NAA: SARS-CoV-2, NAA: NOT DETECTED

## 2019-06-21 IMAGING — CT CT HEAR MORPH WITH CTA COR WITH SCORE WITH CA WITH CONTRAST AND
4 of 7 series · 8 of 20 positions shown, 9 images · IV contrast (APPLIED)
Comparison: None.
COMPARISON: None.

Addendum:
EXAM:
OVER-READ INTERPRETATION  CT CHEST

The following report is an over-read performed by radiologist Dr.
Otto Licea [[email or web address]] on 09/04/2018. This over-read
does not include interpretation of cardiac or coronary anatomy or
pathology. The coronary CTA interpretation by the cardiologist is
attached.
CLINICAL DATA: Chest pain
Cardiac CTA
MEDICATIONS:
Sub lingual nitro. 4mg x 2
TECHNIQUE: The patient was scanned on a Siemens [REDACTED]ice scanner. Gantry
rotation speed was 250 msecs. Collimation was 0.6 mm. A 100 kV
prospective scan was triggered in the ascending thoracic aorta at
35-75% of the R-R interval. Average HR during the scan was 60 bpm.
The 3D data set was interpreted on a dedicated work station using
MPR, MIP and VRT modes. A total of 80cc of contrast was used.

[Series 7: best diast 76 % · axial · 0.39mm/px · z∈[-240,-193]mm · 2 of 353 slices shown, 3 images]
[im 118/353  vessel]
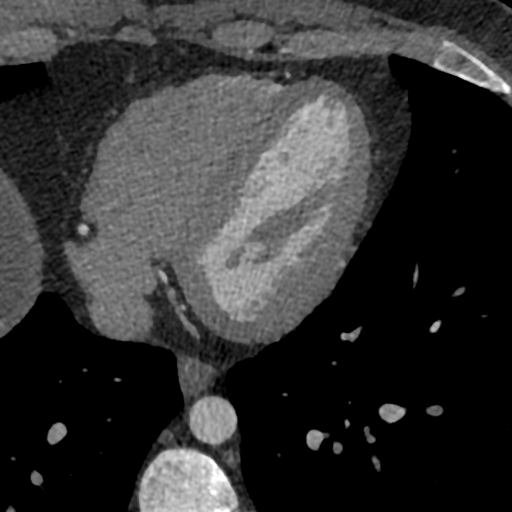
[im 118/353  lung]
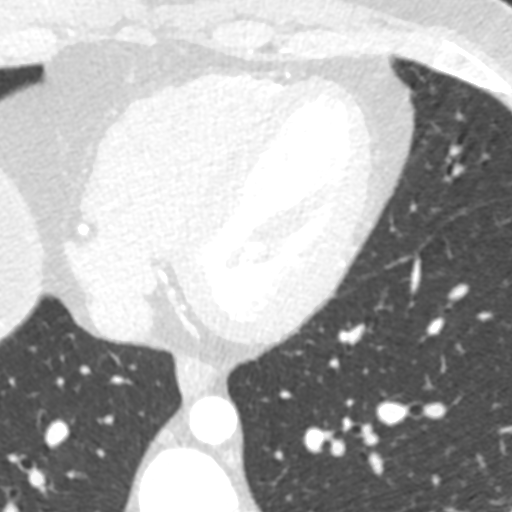
[im 235/353  vessel]
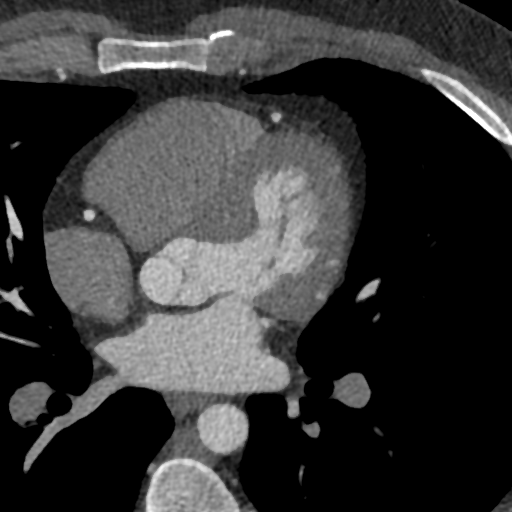

[Series 8: best syst 31 % · axial · 0.39mm/px · z∈[-240,-193]mm · 2 of 353 slices shown]
[im 118/353  vessel]
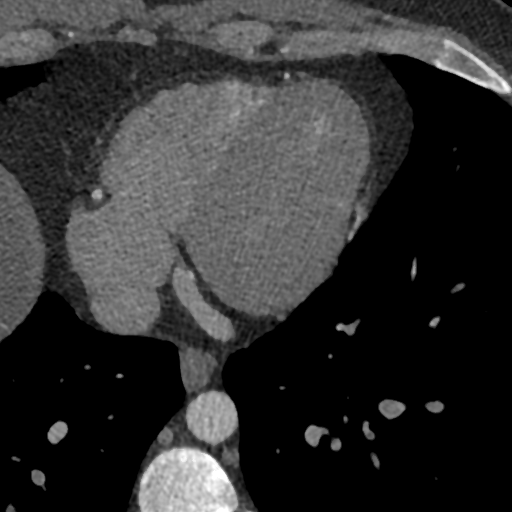
[im 235/353  vessel]
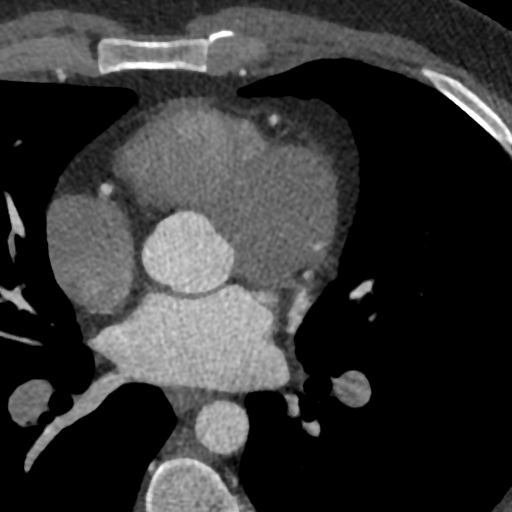

[Series 9: ts diast sharp 76 % · axial · 0.39mm/px · z∈[-240,-193]mm · 2 of 353 slices shown]
[im 118/353  lung]
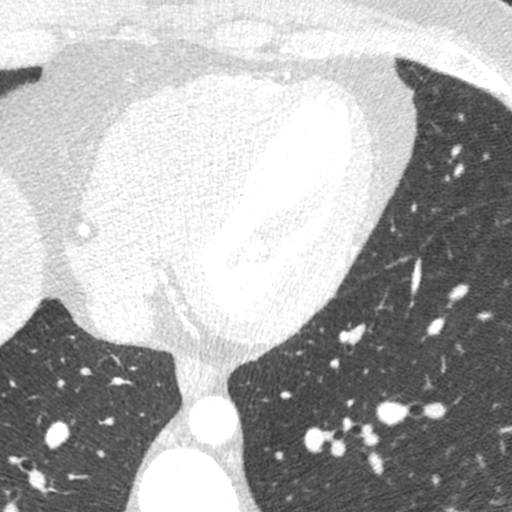
[im 235/353  lung]
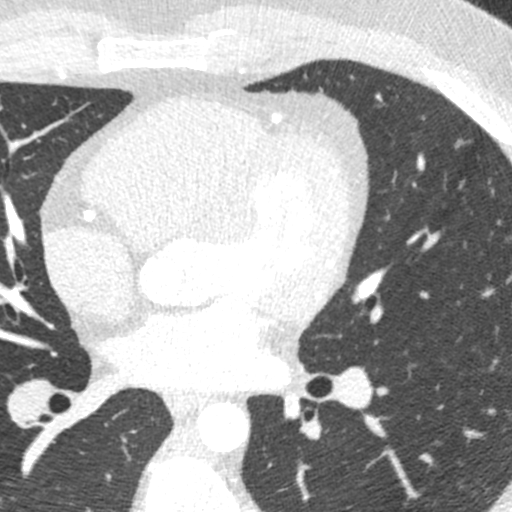

[Series 10: ts syst sharp 31 % · axial · 0.39mm/px · z∈[-240,-193]mm · 2 of 353 slices shown]
[im 118/353  lung]
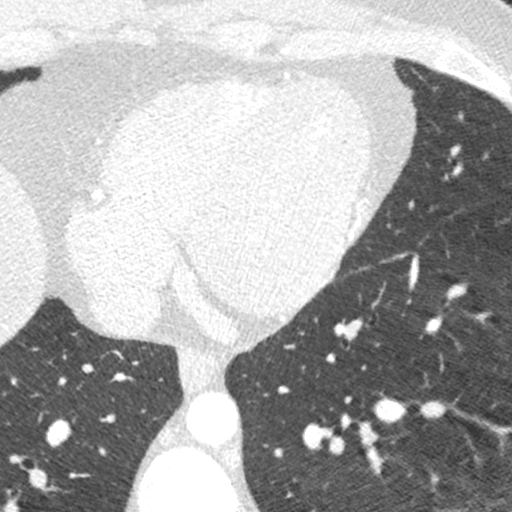
[im 235/353  lung]
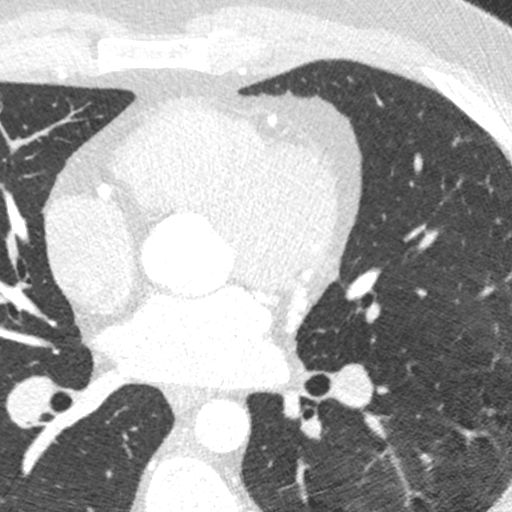

[8 of 20 positions shown; findings below may reference images not displayed]

FINDINGS: Vascular: Heart is normal size.  Visualized aorta is normal caliber.

Mediastinum/Nodes: No adenopathy in the lower mediastinum or hila.

Lungs/Pleura: Visualized lungs clear.  No effusions.

Upper Abdomen: Fatty infiltration of the liver.  No acute findings.

Musculoskeletal: Chest wall soft tissues are unremarkable. No acute
bony abnormality.
IMPRESSION: No acute extra cardiac abnormality.

Fatty infiltration of the liver.
FINDINGS: Non-cardiac: See separate report from [REDACTED].

Pulmonary veins drain normally to the left atrium. Small connection
noted between atria, possible PFO.

Calcium Score: 0 Agatston units.

Coronary Arteries: Right dominant with no anomalies

LM: No plaque or stenosis.

LAD system: No plaque or stenosis.

Circumflex system: Large ramus/high OM1, small AV LCx. No plaque or
stenosis.

RCA system: No plaque or stenosis.
IMPRESSION: 1. Coronary calcium score 0 Agatston units. This suggests low risk
for future cardiac events.

2.  No significant coronary disease noted.  No coronary anomalies.

3.  Possible PFO.

Rudi Jumper

*** End of Addendum ***
EXAM:
OVER-READ INTERPRETATION  CT CHEST

The following report is an over-read performed by radiologist Dr.
Otto Licea [REDACTED] on 09/04/2018. This over-read
does not include interpretation of cardiac or coronary anatomy or
pathology. The coronary CTA interpretation by the cardiologist is
attached.
FINDINGS: Vascular: Heart is normal size.  Visualized aorta is normal caliber.

Mediastinum/Nodes: No adenopathy in the lower mediastinum or hila.

Lungs/Pleura: Visualized lungs clear.  No effusions.

Upper Abdomen: Fatty infiltration of the liver.  No acute findings.

Musculoskeletal: Chest wall soft tissues are unremarkable. No acute
bony abnormality.
IMPRESSION: No acute extra cardiac abnormality.

Fatty infiltration of the liver.

## 2019-07-15 ENCOUNTER — Ambulatory Visit: Payer: Managed Care, Other (non HMO) | Admitting: Podiatry

## 2019-07-15 ENCOUNTER — Other Ambulatory Visit: Payer: Self-pay | Admitting: Podiatry

## 2019-07-15 ENCOUNTER — Other Ambulatory Visit: Payer: Self-pay

## 2019-07-15 ENCOUNTER — Ambulatory Visit (INDEPENDENT_AMBULATORY_CARE_PROVIDER_SITE_OTHER): Payer: Managed Care, Other (non HMO)

## 2019-07-15 ENCOUNTER — Encounter: Payer: Self-pay | Admitting: Podiatry

## 2019-07-15 DIAGNOSIS — M778 Other enthesopathies, not elsewhere classified: Secondary | ICD-10-CM

## 2019-07-15 DIAGNOSIS — M7672 Peroneal tendinitis, left leg: Secondary | ICD-10-CM | POA: Diagnosis not present

## 2019-07-15 DIAGNOSIS — M79672 Pain in left foot: Secondary | ICD-10-CM

## 2019-07-16 ENCOUNTER — Encounter: Payer: Self-pay | Admitting: Podiatry

## 2019-07-16 NOTE — Progress Notes (Signed)
Subjective:  Patient ID: Miguel Sims, male    DOB: September 17, 1995,  MRN: [id number]  Chief Complaint  Patient presents with  . Foot Pain    pt is here for left foot pain, located on the lateral side of the left foot, pt states that he has left foot pain, located on the lateral side of the left foot, pt states that it is painful when walking on it, pain came suddenly, and happened 3 days ago.    24 y.o. male presents with the above complaint.  Patient presents with pain to the left lateral side of the foot.  Is been going for 3 days has progressively gotten worse.  Patient denies any trauma to the area.  He has not been able to fully put his weight down because of the pain associated with it.  Pain is elevated and constant.  He generally ambulates with his work boots on primarily.  Patient has tried icing it but has not helped much.  Pain scale 6 out of 10.  Pain came on suddenly as well.  He has not seen anyone else for this.  He has not tried any other treatment options.   Review of Systems: Negative except as noted in the HPI. Denies N/V/F/Ch.  Past Medical History:  Diagnosis Date  . Allergic rhinitis   . Chest pain   . Diaphoresis   . Dizziness   . Hypertension   . Hypertension   . Ingrown nail of great toe of left foot   . Nausea   . Smoker   . SOB (shortness of breath)   . Syncope and collapse     Current Outpatient Medications:  .  losartan (COZAAR) 25 MG tablet, Take 25 mg by mouth daily., Disp: , Rfl: 5 .  metFORMIN (GLUCOPHAGE-XR) 500 MG 24 hr tablet, TAKE ONE TABLET BY MOUTH WITH LARGEST MEAL, Disp: , Rfl:  .  metoprolol tartrate (LOPRESSOR) 50 MG tablet, Take one tablet by mouth the night before CT and take one tablet by mouth the day of your CT, 2 hours prior to the CT, Disp: 2 tablet, Rfl: 0 .  mometasone (NASONEX) 50 MCG/ACT nasal spray, 2 sprays daily., Disp: , Rfl:   Social History   Tobacco Use  Smoking Status Former Smoker  . Quit date: 08/10/2017  . Years  since quitting: 1.9  Smokeless Tobacco Current User  . Types: Chew, Snuff    Allergies  Allergen Reactions  . Penicillin G     Other reaction(s): Other (See Comments) Unknown reaction   Objective:  There were no vitals filed for this visit. There is no height or weight on file to calculate BMI. Constitutional Well developed. Well nourished.  Vascular Dorsalis pedis pulses palpable bilaterally. Posterior tibial pulses palpable bilaterally. Capillary refill normal to all digits.  No cyanosis or clubbing noted. Pedal hair growth normal.  Neurologic Normal speech. Oriented to person, place, and time. Epicritic sensation to light touch grossly present bilaterally.  Dermatologic Nails well groomed and normal in appearance. No open wounds. No skin lesions.  Orthopedic:  Pain on palpation to the insertion of left peroneal tendon at the fifth metatarsal base.  No pain along the course of the tendon.  Pain with inversion of the foot active and passive.  Pain with resisted eversion of the foot active and passive.  No pain at the posterior tibial tendon, Achilles tendon, ATFL.   Radiographs: 3 views of skeletally mature adult foot: No osseous abnormalities noted.  No  fractures noted.  No avulsions or osteophytes noted.  No other arthritic changes noted. Assessment:   1. Foot pain, left   2. Peroneal tendinitis of left lower leg    Plan:  Patient was evaluated and treated and all questions answered.  Left peroneal tendinitis -I explained to the patient the etiology of peroneal tendinitis and various treatment options were discussed.  Given that patient has moderate to severe pain on palpation to the fifth metatarsal base without any associated fracture I believe patient will benefit from a cam boot immobilization for next 4 weeks. -If there is no improvement I will consider possible steroid injection as well as obtaining an MRI to the left fifth metatarsal base. -Cam boot was dispensed   Return in about 4 weeks (around 08/12/2019).

## 2019-08-14 ENCOUNTER — Ambulatory Visit: Payer: Managed Care, Other (non HMO) | Admitting: Podiatry

## 2021-01-24 ENCOUNTER — Telehealth: Payer: Self-pay | Admitting: *Deleted

## 2021-01-27 ENCOUNTER — Inpatient Hospital Stay: Payer: 59 | Attending: Nurse Practitioner | Admitting: Nurse Practitioner

## 2022-07-11 ENCOUNTER — Ambulatory Visit: Payer: Self-pay | Admitting: Orthopedic Surgery

## 2022-07-11 DIAGNOSIS — M5126 Other intervertebral disc displacement, lumbar region: Secondary | ICD-10-CM

## 2022-07-11 NOTE — H&P (View-Only) (Signed)
Miguel Sims is an 27 y.o. male.   Chief Complaint: back and left leg pain HPI: Reason for Visit: (normal) visit for: (back) Context: work injury; 6 1/2 months 12/20/21 Location (Lower Extremity): lower back pain Severity: pain level 7/10 Timing: come and go Aggravating Factors: standing for ; walking for Alleviating Factors: rest/sitting down/lying down Are you working? not at all Notes: he is currently waiting for clearance for surgery  Past Medical History:  Diagnosis Date   Allergic rhinitis    Chest pain    Diaphoresis    Dizziness    Hypertension    Hypertension    Ingrown nail of great toe of left foot    Nausea    Smoker    SOB (shortness of breath)    Syncope and collapse     Past Surgical History:  Procedure Laterality Date   arm surgery      Family History  Problem Relation Age of Onset   Heart disease Father    Social History:  reports that he has quit smoking. His smokeless tobacco use includes chew and snuff. He reports that he does not currently use alcohol. He reports that he does not currently use drugs.  Allergies:  Allergies  Allergen Reactions   Penicillin G     Other reaction(s): Other (See Comments) Unknown reaction   Current meds: gabapentin 300 mg capsule losartan 25 mg tablet metFORMIN ER 500 mg tablet,extended release 24 hr rosuvastatin 20 mg tablet  Review of Systems  Constitutional: Negative.   HENT: Negative.    Eyes: Negative.   Respiratory: Negative.    Cardiovascular: Negative.   Gastrointestinal: Negative.   Endocrine: Negative.   Genitourinary: Negative.   Musculoskeletal:  Positive for back pain and gait problem.  Skin: Negative.   Neurological:  Positive for weakness and numbness.  Psychiatric/Behavioral: Negative.      There were no vitals taken for this visit. Physical Exam Constitutional:      Appearance: Normal appearance.  HENT:     Head: Normocephalic and atraumatic.     Right Ear: External ear  normal.     Left Ear: External ear normal.     Nose: Nose normal.     Mouth/Throat:     Pharynx: Oropharynx is clear.  Eyes:     Conjunctiva/sclera: Conjunctivae normal.  Cardiovascular:     Rate and Rhythm: Normal rate and regular rhythm.     Pulses: Normal pulses.  Pulmonary:     Effort: Pulmonary effort is normal.  Abdominal:     General: Bowel sounds are normal.  Musculoskeletal:     Cervical back: Normal range of motion.     Comments: Gait and Station: Appearance: ambulating with no assistive devices and antalgic gait.  Constitutional: General Appearance: healthy-appearing and distress (mild).  Psychiatric: Mood and Affect: active and alert.  Cardiovascular System: Edema Right: none; Dorsalis and posterior tibial pulses 2+. Edema Left: none.  Abdomen: Inspection and Palpation: non-distended and no tenderness.  Skin: Inspection and palpation: no rash.  Lumbar Spine: Inspection: normal alignment. Bony Palpation of the Lumbar Spine: tender at lumbosacral junction.. Bony Palpation of the Right Hip: no tenderness of the greater trochanter and tenderness of the SI joint; Pelvis stable. Bony Palpation of the Left Hip: no tenderness of the greater trochanter and tenderness of the SI joint. Soft Tissue Palpation on the Right: No flank pain with percussion. Active Range of Motion: limited flexion and extention.  Motor Strength: L1 Motor Strength on the Right:   hip flexion iliopsoas 5/5. L1 Motor Strength on the Left: hip flexion iliopsoas 5/5. L2-L4 Motor Strength on the Right: knee extension quadriceps 5/5. L2-L4 Motor Strength on the Left: knee extension quadriceps 5/5. L5 Motor Strength on the Right: ankle dorsiflexion tibialis anterior 5/5 and great toe extension extensor hallucis longus 5/5. L5 Motor Strength on the Left: ankle dorsiflexion tibialis anterior 5/5 and great toe extension extensor hallucis longus 5/5. S1 Motor Strength on the Right: plantar flexion gastrocnemius 5/5. S1  Motor Strength on the Left: plantar flexion gastrocnemius 5/5.  Neurological System: Knee Reflex Right: normal (2). Knee Reflex Left: normal (2). Ankle Reflex Right: normal (2). Ankle Reflex Left: normal (2). Babinski Reflex Right: plantar reflex absent. Babinski Reflex Left: plantar reflex absent. Sensation on the Right: normal distal extremities. Sensation on the Left: normal distal extremities. Special Tests on the Right: no clonus of the ankle/knee. Special Tests on the Left: no clonus of the ankle/knee and seated straight leg raising test positive.  EHL is 4+/5 on the left  Skin:    General: Skin is warm and dry.  Neurological:     Mental Status: He is alert.    MRI again demonstrates a paracentral disc herniation L4-5 compressing the L5 nerve root there is underlying disc degeneration L4-5.  Assessment/Plan Impression:  1. 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 This is been refractory to rest, activity modification, physical therapy, epidural steroid injections and analgesics and is 6-month status post injury 2. Non-insulin-dependent diabetes recent A1c less than 7. Will proceed with surgical decompression  Plan:  We discussed options living with his symptoms versus a microdiscectomy he would like to proceed with the latter.  I had an extensive discussion with the patient concerning the pathology relevant anatomy and treatment options. At this point exhausting conservative treatment and in the presence of a neurologic deficit we discussed microlumbar decompression. I discussed the risks and benefits including bleeding, infection, DVT, PE, anesthetic complications, worsening in their symptoms, improvement in their symptoms, C SF leakage, epidural fibrosis, need for future surgeries such as revision discectomy and lumbar fusion. I also indicated that this is an operation to basically decompress the nerve roots to allow  recovery as opposed to fixing a herniated disc if it is encountered and that the incidence of recurrent chest disc herniation can approach 15%. Also that nerve root recovery is variable and may not recover completely. Any ligament or bone that is contributing to compressing the nerves will be removed as well.  I discussed the operative course including overnight in the hospital. Immediate ambulation. Follow-up in 2 weeks for suture removal. 6 weeks until healing of the herniation and surgical incision followed by 6 weeks of reconditioning and strengthening of the core musculature. Also discussed the need to employ the concepts of disc pressure management and core motion following the surgery to minimize the risk of recurrent disc herniation. We will obtain preoperative clearance i if necessary and proceed accordingly.  No history of DVT or MRSA. Outpatient. Kefzol and oxycodone.  Out of work in the interim  Plan microdiscectomy L4-5 left  Cuthbert Turton M Arn Mcomber, PA-C for Dr Beane 07/11/2022, 3:38 PM    

## 2022-07-11 NOTE — H&P (Signed)
Miguel Sims is an 27 y.o. male.   Chief Complaint: back and left leg pain HPI: Reason for Visit: (normal) visit for: (back) Context: work injury; 6 1/2 months 12/20/21 Location (Lower Extremity): lower back pain Severity: pain level 7/10 Timing: come and go Aggravating Factors: standing for ; walking for Alleviating Factors: rest/sitting down/lying down Are you working? not at all Notes: he is currently waiting for clearance for surgery  Past Medical History:  Diagnosis Date   Allergic rhinitis    Chest pain    Diaphoresis    Dizziness    Hypertension    Hypertension    Ingrown nail of great toe of left foot    Nausea    Smoker    SOB (shortness of breath)    Syncope and collapse     Past Surgical History:  Procedure Laterality Date   arm surgery      Family History  Problem Relation Age of Onset   Heart disease Father    Social History:  reports that he has quit smoking. His smokeless tobacco use includes chew and snuff. He reports that he does not currently use alcohol. He reports that he does not currently use drugs.  Allergies:  Allergies  Allergen Reactions   Penicillin G     Other reaction(s): Other (See Comments) Unknown reaction   Current meds: gabapentin 300 mg capsule losartan 25 mg tablet metFORMIN ER 500 mg tablet,extended release 24 hr rosuvastatin 20 mg tablet  Review of Systems  Constitutional: Negative.   HENT: Negative.    Eyes: Negative.   Respiratory: Negative.    Cardiovascular: Negative.   Gastrointestinal: Negative.   Endocrine: Negative.   Genitourinary: Negative.   Musculoskeletal:  Positive for back pain and gait problem.  Skin: Negative.   Neurological:  Positive for weakness and numbness.  Psychiatric/Behavioral: Negative.      There were no vitals taken for this visit. Physical Exam Constitutional:      Appearance: Normal appearance.  HENT:     Head: Normocephalic and atraumatic.     Right Ear: External ear  normal.     Left Ear: External ear normal.     Nose: Nose normal.     Mouth/Throat:     Pharynx: Oropharynx is clear.  Eyes:     Conjunctiva/sclera: Conjunctivae normal.  Cardiovascular:     Rate and Rhythm: Normal rate and regular rhythm.     Pulses: Normal pulses.  Pulmonary:     Effort: Pulmonary effort is normal.  Abdominal:     General: Bowel sounds are normal.  Musculoskeletal:     Cervical back: Normal range of motion.     Comments: Gait and Station: Appearance: ambulating with no assistive devices and antalgic gait.  Constitutional: General Appearance: healthy-appearing and distress (mild).  Psychiatric: Mood and Affect: active and alert.  Cardiovascular System: Edema Right: none; Dorsalis and posterior tibial pulses 2+. Edema Left: none.  Abdomen: Inspection and Palpation: non-distended and no tenderness.  Skin: Inspection and palpation: no rash.  Lumbar Spine: Inspection: normal alignment. Bony Palpation of the Lumbar Spine: tender at lumbosacral junction.. Bony Palpation of the Right Hip: no tenderness of the greater trochanter and tenderness of the SI joint; Pelvis stable. Bony Palpation of the Left Hip: no tenderness of the greater trochanter and tenderness of the SI joint. Soft Tissue Palpation on the Right: No flank pain with percussion. Active Range of Motion: limited flexion and extention.  Motor Strength: L1 Motor Strength on the Right:  hip flexion iliopsoas 5/5. L1 Motor Strength on the Left: hip flexion iliopsoas 5/5. L2-L4 Motor Strength on the Right: knee extension quadriceps 5/5. L2-L4 Motor Strength on the Left: knee extension quadriceps 5/5. L5 Motor Strength on the Right: ankle dorsiflexion tibialis anterior 5/5 and great toe extension extensor hallucis longus 5/5. L5 Motor Strength on the Left: ankle dorsiflexion tibialis anterior 5/5 and great toe extension extensor hallucis longus 5/5. S1 Motor Strength on the Right: plantar flexion gastrocnemius 5/5. S1  Motor Strength on the Left: plantar flexion gastrocnemius 5/5.  Neurological System: Knee Reflex Right: normal (2). Knee Reflex Left: normal (2). Ankle Reflex Right: normal (2). Ankle Reflex Left: normal (2). Babinski Reflex Right: plantar reflex absent. Babinski Reflex Left: plantar reflex absent. Sensation on the Right: normal distal extremities. Sensation on the Left: normal distal extremities. Special Tests on the Right: no clonus of the ankle/knee. Special Tests on the Left: no clonus of the ankle/knee and seated straight leg raising test positive.  EHL is 4+/5 on the left  Skin:    General: Skin is warm and dry.  Neurological:     Mental Status: He is alert.    MRI again demonstrates a paracentral disc herniation L4-5 compressing the L5 nerve root there is underlying disc degeneration L4-5.  Assessment/Plan Impression:  1. 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 This is been refractory to rest, activity modification, physical therapy, epidural steroid injections and analgesics and is 31-month status post injury 2. Non-insulin-dependent diabetes recent A1c less than 7. Will proceed with surgical decompression  Plan:  We discussed options living with his symptoms versus a microdiscectomy he would like to proceed with the latter.  I had an extensive discussion with the patient concerning the pathology relevant anatomy and treatment options. At this point exhausting conservative treatment and in the presence of a neurologic deficit we discussed microlumbar decompression. I discussed the risks and benefits including bleeding, infection, DVT, PE, anesthetic complications, worsening in their symptoms, improvement in their symptoms, C SF leakage, epidural fibrosis, need for future surgeries such as revision discectomy and lumbar fusion. I also indicated that this is an operation to basically decompress the nerve roots to allow  recovery as opposed to fixing a herniated disc if it is encountered and that the incidence of recurrent chest disc herniation can approach 15%. Also that nerve root recovery is variable and may not recover completely. Any ligament or bone that is contributing to compressing the nerves will be removed as well.  I discussed the operative course including overnight in the hospital. Immediate ambulation. Follow-up in 2 weeks for suture removal. 6 weeks until healing of the herniation and surgical incision followed by 6 weeks of reconditioning and strengthening of the core musculature. Also discussed the need to employ the concepts of disc pressure management and core motion following the surgery to minimize the risk of recurrent disc herniation. We will obtain preoperative clearance i if necessary and proceed accordingly.  No history of DVT or MRSA. Outpatient. Kefzol and oxycodone.  Out of work in the interim  Plan microdiscectomy L4-5 left  Cecilie Kicks, [place] for Dr Tonita Cong 07/11/2022, 3:38 PM

## 2022-07-16 NOTE — Pre-Procedure Instructions (Signed)
Surgical Instructions    Your procedure is scheduled on July 19, 2022.  Report to [facility] Main Entrance "A" at 5:30 A.M., then check in with the Admitting office.  Call this number if you have problems the morning of surgery:  [phone]   If you have any questions prior to your surgery date call [phone]: Open Monday-Friday 8am-4pm If you experience any cold or flu symptoms such as cough, fever, chills, shortness of breath, etc. between now and your scheduled surgery, please notify us at the above number     Remember:  Do not eat after midnight the night before your surgery  You may drink clear liquids until 4:30AM the morning of your surgery.   Clear liquids allowed are: Water, Non-Citrus Juices (without pulp), Carbonated Beverages, Clear Tea, Black Coffee ONLY (NO MILK, CREAM OR POWDERED CREAMER of any kind), and Gatorade   Patient Instructions  The night before surgery:  No food after midnight. ONLY clear liquids after midnight    The day of surgery (if you have diabetes): Drink ONE (1) 12 oz G2 given to you in your pre admission testing appointment by 4:30AM the morning of surgery. Drink in one sitting. Do not sip.  This drink was given to you during your hospital  pre-op appointment visit.  Nothing else to drink after completing the  12 oz bottle of G2.         If you have questions, please contact your surgeon's office.   Take these medicines the morning of surgery with A SIP OF WATER:  rosuvastatin (CRESTOR)   If needed:  gabapentin (NEURONTIN   As of today, STOP taking any Aspirin (unless otherwise instructed by your surgeon) Aleve, Naproxen, Ibuprofen, Motrin, Advil, Goody's, BC's, all herbal medications, fish oil, and all vitamins.          WHAT DO I DO ABOUT MY DIABETES MEDICATION?   Do not take oral diabetes medicines (pills) the morning of surgery. DO NOT TAKE Metformin (Glucophage) the day of surgery.  The day of surgery, do not take other  diabetes injectables, including Byetta (exenatide), Bydureon (exenatide ER), Victoza (liraglutide), or Trulicity (dulaglutide).  If your CBG is greater than 220 mg/dL, you may take  of your sliding scale (correction) dose of insulin.   HOW TO MANAGE YOUR DIABETES BEFORE AND AFTER SURGERY  Why is it important to control my blood sugar before and after surgery? Improving blood sugar levels before and after surgery helps healing and can limit problems. A way of improving blood sugar control is eating a healthy diet by:  Eating less sugar and carbohydrates  Increasing activity/exercise  Talking with your doctor about reaching your blood sugar goals High blood sugars (greater than 180 mg/dL) can raise your risk of infections and slow your recovery, so you will need to focus on controlling your diabetes during the weeks before surgery. Make sure that the doctor who takes care of your diabetes knows about your planned surgery including the date and location.  How do I manage my blood sugar before surgery? Check your blood sugar at least 4 times a day, starting 2 days before surgery, to make sure that the level is not too high or low.  Check your blood sugar the morning of your surgery when you wake up and every 2 hours until you get to the Short Stay unit.  If your blood sugar is less than 70 mg/dL, you will need to treat for low blood sugar: Do not take  insulin. Treat a low blood sugar (less than 70 mg/dL) with  cup of clear juice (cranberry or apple), 4 glucose tablets, OR glucose gel. Recheck blood sugar in 15 minutes after treatment (to make sure it is greater than 70 mg/dL). If your blood sugar is not greater than 70 mg/dL on recheck, call [phone] for further instructions. Report your blood sugar to the short stay nurse when you get to Short Stay.  If you are admitted to the hospital after surgery: Your blood sugar will be checked by the staff and you will probably be given insulin  after surgery (instead of oral diabetes medicines) to make sure you have good blood sugar levels. The goal for blood sugar control after surgery is 80-180 mg/dL.    [facility] is not responsible for any belongings or valuables.    Do NOT Smoke (Tobacco/Vaping)  24 hours prior to your procedure  If you use a CPAP at night, you may bring your mask for your overnight stay.   Contacts, glasses, hearing aids, dentures or partials may not be worn into surgery, please bring cases for these belongings   For patients admitted to the hospital, discharge time will be determined by your treatment team.   Patients discharged the day of surgery will not be allowed to drive home, and someone needs to stay with them for 24 hours.   SURGICAL WAITING ROOM VISITATION Patients having surgery or a procedure may have no more than 2 support people in the waiting area - these visitors may rotate.   Children under the age of 70 must have an adult with them who is not the patient. If the patient needs to stay at the hospital during part of their recovery, the visitor guidelines for inpatient rooms apply. Pre-op nurse will coordinate an appropriate time for 1 support person to accompany patient in pre-op.  This support person may not rotate.   Please refer to [email or web address] for the visitor guidelines for Inpatients (after your surgery is over and you are in a regular room).    Special instructions:    Oral Hygiene is also important to reduce your risk of infection.  Remember - BRUSH YOUR TEETH THE MORNING OF SURGERY WITH YOUR REGULAR TOOTHPASTE   Day of Surgery:  Take a shower with CHG soap. Wear Clean/Comfortable clothing the morning of surgery Do not wear jewelry or makeup. Do not wear lotions, powders, perfumes/cologne or deodorant. Do not shave 48 hours prior to surgery.  Men may shave face and neck. Do not bring valuables to the hospital. Do  not wear nail polish, gel polish, artificial nails, or any other type of covering on natural nails (fingers and toes) If you have artificial nails or gel coating that need to be removed by a nail salon, please have this removed prior to surgery. Artificial nails or gel coating may interfere with anesthesia's ability to adequately monitor your vital signs. Remember to brush your teeth WITH YOUR REGULAR TOOTHPASTE.    If you received a COVID test during your pre-op visit, it is requested that you wear a mask when out in public, stay away from anyone that may not be feeling well, and notify your surgeon if you develop symptoms. If you have been in contact with anyone that has tested positive in the last 10 days, please notify your surgeon.    Please read over the following fact sheets that you were given.

## 2022-07-17 ENCOUNTER — Encounter (HOSPITAL_COMMUNITY): Payer: Self-pay

## 2022-07-17 ENCOUNTER — Other Ambulatory Visit: Payer: Self-pay

## 2022-07-17 ENCOUNTER — Encounter (HOSPITAL_COMMUNITY)
Admission: RE | Admit: 2022-07-17 | Discharge: 2022-07-17 | Disposition: A | Discharge status: Home / Self Care | Payer: 59 | Source: Ambulatory Visit | Attending: Specialist | Admitting: Specialist

## 2022-07-17 ENCOUNTER — Ambulatory Visit (HOSPITAL_COMMUNITY)
Admission: RE | Admit: 2022-07-17 | Discharge: 2022-07-17 | Disposition: A | Discharge status: Home / Self Care | Payer: 59 | Source: Ambulatory Visit | Attending: Orthopedic Surgery | Admitting: Orthopedic Surgery

## 2022-07-17 VITALS — BP 157/101 | HR 94 | Temp 98.2°F | Resp 18 | Ht 77.0 in | Wt 294.4 lb

## 2022-07-17 DIAGNOSIS — E119 Type 2 diabetes mellitus without complications: Secondary | ICD-10-CM | POA: Diagnosis not present

## 2022-07-17 DIAGNOSIS — M5126 Other intervertebral disc displacement, lumbar region: Secondary | ICD-10-CM | POA: Diagnosis not present

## 2022-07-17 DIAGNOSIS — Z01818 Encounter for other preprocedural examination: Secondary | ICD-10-CM | POA: Diagnosis present

## 2022-07-17 DIAGNOSIS — I10 Essential (primary) hypertension: Secondary | ICD-10-CM | POA: Diagnosis not present

## 2022-07-17 HISTORY — DX: Attention-deficit hyperactivity disorder, unspecified type: F90.9

## 2022-07-17 HISTORY — DX: Anxiety disorder, unspecified: F41.9

## 2022-07-17 HISTORY — DX: Type 2 diabetes mellitus without complications: E11.9

## 2022-07-17 LAB — SURGICAL PCR SCREEN
MRSA, PCR: NEGATIVE
Staphylococcus aureus: POSITIVE — AB

## 2022-07-17 LAB — GLUCOSE, CAPILLARY: Glucose-Capillary: 207 mg/dL — ABNORMAL HIGH (ref 70–99)

## 2022-07-17 NOTE — Progress Notes (Signed)
PCP - Aletha Halim, PA-C Cardiologist - Denies  PPM/ICD - Denies  Chest x-ray - (Lumbar X-Ray) 07/17/2022 EKG - 07/17/2022 Stress Test - Denies ECHO - 09/16/2018 Cardiac Cath - Denies  Sleep Study - Denies  DM: Type II Fasting Blood Sugar - 170-180 Checks Blood Sugar multiple times a day  Blood Thinner Instructions: N/A Aspirin Instructions: N/A  ERAS Protcol - Yes PRE-SURGERY Ensure or G2- G2  COVID TEST- N/A   Anesthesia review: Yes, Ha1c 9.9 on 07/12/2022.  Patient denies shortness of breath, fever, cough and chest pain at PAT appointment   All instructions explained to the patient, with a verbal understanding of the material. Patient agrees to go over the instructions while at home for a better understanding.The opportunity to ask questions was provided.

## 2022-07-18 NOTE — Anesthesia Preprocedure Evaluation (Addendum)
Anesthesia Evaluation  Patient identified by MRN, date of birth, ID band Patient awake    Reviewed: Allergy & Precautions, H&P , NPO status , Patient's Chart, lab work & pertinent test results  Airway Mallampati: II  TM Distance: >3 FB Neck ROM: Full    Dental no notable dental hx. (+) Teeth Intact, Dental Advisory Given   Pulmonary former smoker   Pulmonary exam normal breath sounds clear to auscultation       Cardiovascular hypertension, Pt. on medications  Rhythm:Regular Rate:Normal     Neuro/Psych   Anxiety     negative neurological ROS     GI/Hepatic negative GI ROS, Neg liver ROS,,,  Endo/Other  diabetes, Poorly Controlled, Type 2, Oral Hypoglycemic Agents    Renal/GU negative Renal ROS  negative genitourinary   Musculoskeletal   Abdominal   Peds  Hematology negative hematology ROS (+)   Anesthesia Other Findings   Reproductive/Obstetrics negative OB ROS                             Anesthesia Physical Anesthesia Plan  ASA: 3  Anesthesia Plan: General   Post-op Pain Management: Toradol IV (intra-op)* and Ofirmev IV (intra-op)*   Induction: Intravenous  PONV Risk Score and Plan: 3 and Ondansetron, Dexamethasone and Midazolam  Airway Management Planned: Oral ETT  Additional Equipment:   Intra-op Plan:   Post-operative Plan: Extubation in OR  Informed Consent: I have reviewed the patients History and Physical, chart, labs and discussed the procedure including the risks, benefits and alternatives for the proposed anesthesia with the patient or authorized representative who has indicated his/her understanding and acceptance.     Dental advisory given  Plan Discussed with: CRNA  Anesthesia Plan Comments: (PAT note by Antionette Poles, PA-C: 27 year old male with history of HTN, DM2.  Patient seen by PCP Mady Gemma, PA-C on 07/12/2022 for preop evaluation.  Lab work done at  that time notable for A1c of 9.9, AST 51, ALT 101, T. bili 1.9.  BBC unremarkable with Hgb 15.1, WBC 6.5, platelets 194.  PCP did cleared patient for surgery in letter dated 07/12/2027 stating, "patient has A1c of 9.9.  I have increased his metformin and attached his blood work.  He has been getting some steroid treatments so will defer to surgeon."  Patient was evaluated by cardiology in April 2020 for complaint of syncope and dyspnea.  Patient was initially evaluated in the ER on 07/26/2018 with report of chest pain, dyspnea, nausea, diaphoresis going on for a week as well as a syncopal episode.  ED eval was benign, negative troponin x 2, normal ECG, chest x-ray, labs.  Patient followed up with cardiologist Dr. Eden Emms on 08/01/2018 and echo and coronary CT were ordered.  Coronary CT showed calcium score of 0, no significant coronary disease, possible PFO.  Echo showed normal biventricular function, normal valves.  EKG 07/17/2022: Normal sinus rhythm. Rate 79.  TTE 09/16/2018: 1. The left ventricle has normal systolic function with an ejection  fraction of 60-65%. The cavity size was normal. Left ventricular diastolic  parameters were normal.  2. The right ventricle has normal systolic function. The cavity was  normal. There is no increase in right ventricular wall thickness.  3. Left atrial size was mildly dilated.   Coronary CT 09/04/2018: IMPRESSION: 1. Coronary calcium score 0 Agatston units. This suggests low risk for future cardiac events.  2.  No significant coronary disease noted.  No coronary  anomalies.  3.  Possible PFO.  )        Anesthesia Quick Evaluation

## 2022-07-18 NOTE — Progress Notes (Signed)
Anesthesia Chart Review:  27 year old male with history of HTN, DM2.  Patient seen by PCP Bing Matter, PA-C on 07/12/2022 for preop evaluation.  Lab work done at that time notable for A1c of 9.9, AST 51, ALT 101, T. bili 1.9.  BBC unremarkable with Hgb 15.1, WBC 6.5, platelets 194.  PCP did cleared patient for surgery in letter dated 07/12/2027 stating, "patient has A1c of 9.9.  I have increased his metformin and attached his blood work.  He has been getting some steroid treatments so will defer to surgeon."  Patient was evaluated by cardiology in April 2020 for complaint of syncope and dyspnea.  Patient was initially evaluated in the ER on 07/26/2018 with report of chest pain, dyspnea, nausea, diaphoresis going on for a week as well as a syncopal episode.  ED eval was benign, negative troponin x 2, normal ECG, chest x-ray, labs.  Patient followed up with cardiologist Dr. Johnsie Cancel on 08/01/2018 and echo and coronary CT were ordered.  Coronary CT showed calcium score of 0, no significant coronary disease, possible PFO.  Echo showed normal biventricular function, normal valves.  EKG 07/17/2022: Normal sinus rhythm. Rate 79.  TTE 09/16/2018:  1. The left ventricle has normal systolic function with an ejection  fraction of 60-65%. The cavity size was normal. Left ventricular diastolic  parameters were normal.   2. The right ventricle has normal systolic function. The cavity was  normal. There is no increase in right ventricular wall thickness.   3. Left atrial size was mildly dilated.   Coronary CT 09/04/2018: IMPRESSION: 1. Coronary calcium score 0 Agatston units. This suggests low risk for future cardiac events.   2.  No significant coronary disease noted.  No coronary anomalies.   3.  Possible PFO.    Wynonia Musty [facility] Short Stay Center/Anesthesiology Phone [phone] 07/18/2022 10:14 AM

## 2022-07-19 ENCOUNTER — Encounter (HOSPITAL_COMMUNITY)
Admission: RE | Disposition: A | Discharge status: Home / Self Care | Payer: Self-pay | Source: Home / Self Care | Attending: Specialist

## 2022-07-19 ENCOUNTER — Ambulatory Visit (HOSPITAL_COMMUNITY): Payer: 59

## 2022-07-19 ENCOUNTER — Other Ambulatory Visit: Payer: Self-pay

## 2022-07-19 ENCOUNTER — Ambulatory Visit (HOSPITAL_BASED_OUTPATIENT_CLINIC_OR_DEPARTMENT_OTHER): Payer: No Typology Code available for payment source | Admitting: Anesthesiology

## 2022-07-19 ENCOUNTER — Ambulatory Visit (HOSPITAL_COMMUNITY): Payer: No Typology Code available for payment source | Admitting: Physician Assistant

## 2022-07-19 ENCOUNTER — Encounter (HOSPITAL_COMMUNITY): Payer: Self-pay | Admitting: Specialist

## 2022-07-19 ENCOUNTER — Ambulatory Visit (HOSPITAL_COMMUNITY)
Admission: RE | Admit: 2022-07-19 | Discharge: 2022-07-20 | Disposition: A | Discharge status: Home / Self Care | Payer: No Typology Code available for payment source | Attending: Specialist | Admitting: Specialist

## 2022-07-19 DIAGNOSIS — M48061 Spinal stenosis, lumbar region without neurogenic claudication: Secondary | ICD-10-CM | POA: Insufficient documentation

## 2022-07-19 DIAGNOSIS — M5116 Intervertebral disc disorders with radiculopathy, lumbar region: Secondary | ICD-10-CM | POA: Diagnosis not present

## 2022-07-19 DIAGNOSIS — M21372 Foot drop, left foot: Secondary | ICD-10-CM | POA: Insufficient documentation

## 2022-07-19 DIAGNOSIS — E785 Hyperlipidemia, unspecified: Secondary | ICD-10-CM | POA: Diagnosis not present

## 2022-07-19 DIAGNOSIS — E1165 Type 2 diabetes mellitus with hyperglycemia: Secondary | ICD-10-CM

## 2022-07-19 DIAGNOSIS — Z87891 Personal history of nicotine dependence: Secondary | ICD-10-CM | POA: Insufficient documentation

## 2022-07-19 DIAGNOSIS — F909 Attention-deficit hyperactivity disorder, unspecified type: Secondary | ICD-10-CM | POA: Insufficient documentation

## 2022-07-19 DIAGNOSIS — I10 Essential (primary) hypertension: Secondary | ICD-10-CM | POA: Insufficient documentation

## 2022-07-19 DIAGNOSIS — M5126 Other intervertebral disc displacement, lumbar region: Secondary | ICD-10-CM | POA: Diagnosis not present

## 2022-07-19 DIAGNOSIS — Z7984 Long term (current) use of oral hypoglycemic drugs: Secondary | ICD-10-CM

## 2022-07-19 DIAGNOSIS — E119 Type 2 diabetes mellitus without complications: Secondary | ICD-10-CM | POA: Insufficient documentation

## 2022-07-19 DIAGNOSIS — I878 Other specified disorders of veins: Secondary | ICD-10-CM | POA: Diagnosis not present

## 2022-07-19 DIAGNOSIS — Z09 Encounter for follow-up examination after completed treatment for conditions other than malignant neoplasm: Secondary | ICD-10-CM | POA: Insufficient documentation

## 2022-07-19 DIAGNOSIS — Z01818 Encounter for other preprocedural examination: Secondary | ICD-10-CM

## 2022-07-19 DIAGNOSIS — Y990 Civilian activity done for income or pay: Secondary | ICD-10-CM | POA: Diagnosis not present

## 2022-07-19 HISTORY — PX: LUMBAR LAMINECTOMY/DECOMPRESSION MICRODISCECTOMY: SHX5026

## 2022-07-19 LAB — GLUCOSE, CAPILLARY
Glucose-Capillary: 149 mg/dL — ABNORMAL HIGH (ref 70–99)
Glucose-Capillary: 180 mg/dL — ABNORMAL HIGH (ref 70–99)
Glucose-Capillary: 171 mg/dL — ABNORMAL HIGH (ref 70–99)
Glucose-Capillary: 201 mg/dL — ABNORMAL HIGH (ref 70–99)
Glucose-Capillary: 160 mg/dL — ABNORMAL HIGH (ref 70–99)

## 2022-07-19 SURGERY — LUMBAR LAMINECTOMY/DECOMPRESSION MICRODISCECTOMY 1 LEVEL
Anesthesia: General | Laterality: Left

## 2022-07-19 MED ORDER — ONDANSETRON HCL 4 MG/2ML IJ SOLN
INTRAMUSCULAR | Status: DC | PRN
  Administered 2022-07-19: 4 mg via INTRAVENOUS

## 2022-07-19 MED ORDER — POLYETHYLENE GLYCOL 3350 17 G PO PACK: 17.0000 g | PACK | Freq: Every day | ORAL | Status: DC | PRN

## 2022-07-19 MED ORDER — PROPOFOL 10 MG/ML IV BOLUS
INTRAVENOUS | Status: AC
  Filled 2022-07-19: qty 20

## 2022-07-19 MED ORDER — ACETAMINOPHEN 500 MG PO TABS: 1000.0000 mg | ORAL_TABLET | Freq: Once | ORAL | Status: DC

## 2022-07-19 MED ORDER — INSULIN ASPART 100 UNIT/ML IJ SOLN
INTRAMUSCULAR | Status: AC
  Filled 2022-07-19: qty 1

## 2022-07-19 MED ORDER — DOCUSATE SODIUM 100 MG PO CAPS
100.0000 mg | ORAL_CAPSULE | Freq: Two times a day (BID) | ORAL | Status: DC
  Administered 2022-07-19 – 2022-07-20 (×3): 100 mg via ORAL
  Filled 2022-07-19 (×3): qty 1

## 2022-07-19 MED ORDER — ROCURONIUM BROMIDE 10 MG/ML (PF) SYRINGE
PREFILLED_SYRINGE | INTRAVENOUS | Status: AC
  Filled 2022-07-19: qty 10

## 2022-07-19 MED ORDER — CEFAZOLIN SODIUM 1 G IJ SOLR
INTRAMUSCULAR | Status: AC
  Filled 2022-07-19: qty 10

## 2022-07-19 MED ORDER — MENTHOL 3 MG MT LOZG: 1.0000 | LOZENGE | OROMUCOSAL | Status: DC | PRN

## 2022-07-19 MED ORDER — THROMBIN 20000 UNITS EX SOLR
CUTANEOUS | Status: DC | PRN
  Administered 2022-07-19: 20 mL

## 2022-07-19 MED ORDER — ACETAMINOPHEN 650 MG RE SUPP: 650.0000 mg | RECTAL | Status: DC | PRN

## 2022-07-19 MED ORDER — ACETAMINOPHEN 10 MG/ML IV SOLN
1000.0000 mg | INTRAVENOUS | Status: DC
  Filled 2022-07-19: qty 100

## 2022-07-19 MED ORDER — METHOCARBAMOL 500 MG PO TABS
500.0000 mg | ORAL_TABLET | Freq: Four times a day (QID) | ORAL | Status: DC | PRN
  Administered 2022-07-19 – 2022-07-20 (×3): 500 mg via ORAL
  Filled 2022-07-19 (×3): qty 1

## 2022-07-19 MED ORDER — HYDROMORPHONE HCL 1 MG/ML IJ SOLN
INTRAMUSCULAR | Status: AC
  Filled 2022-07-19: qty 1

## 2022-07-19 MED ORDER — PROPOFOL 500 MG/50ML IV EMUL
INTRAVENOUS | Status: DC | PRN
  Administered 2022-07-19: 25 ug/kg/min via INTRAVENOUS

## 2022-07-19 MED ORDER — DOCUSATE SODIUM 100 MG PO CAPS: 100.0000 mg | ORAL_CAPSULE | Freq: Two times a day (BID) | ORAL | 1 refills | Status: AC | PRN

## 2022-07-19 MED ORDER — PHENOL 1.4 % MT LIQD: 1.0000 | OROMUCOSAL | Status: DC | PRN

## 2022-07-19 MED ORDER — PRONTOSAN WOUND IRRIGATION OPTIME
TOPICAL | Status: DC | PRN
  Administered 2022-07-19: 350 mL

## 2022-07-19 MED ORDER — OXYCODONE HCL 5 MG PO TABS: 5.0000 mg | ORAL_TABLET | ORAL | Status: DC | PRN

## 2022-07-19 MED ORDER — METHOCARBAMOL 1000 MG/10ML IJ SOLN: 500.0000 mg | Freq: Four times a day (QID) | INTRAVENOUS | Status: DC | PRN

## 2022-07-19 MED ORDER — INSULIN ASPART 100 UNIT/ML IJ SOLN
0.0000 [IU] | Freq: Three times a day (TID) | INTRAMUSCULAR | Status: DC
  Administered 2022-07-20: 3 [IU] via SUBCUTANEOUS

## 2022-07-19 MED ORDER — ALUM & MAG HYDROXIDE-SIMETH 200-200-20 MG/5ML PO SUSP: 30.0000 mL | Freq: Four times a day (QID) | ORAL | Status: DC | PRN

## 2022-07-19 MED ORDER — CEFAZOLIN IN SODIUM CHLORIDE 3-0.9 GM/100ML-% IV SOLN
3.0000 g | INTRAVENOUS | Status: AC
  Administered 2022-07-19: 3 g via INTRAVENOUS
  Filled 2022-07-19: qty 100

## 2022-07-19 MED ORDER — SUGAMMADEX SODIUM 200 MG/2ML IV SOLN
INTRAVENOUS | Status: DC | PRN
  Administered 2022-07-19: 75 mg via INTRAVENOUS
  Administered 2022-07-19 (×2): 100 mg via INTRAVENOUS

## 2022-07-19 MED ORDER — PHENYLEPHRINE 80 MCG/ML (10ML) SYRINGE FOR IV PUSH (FOR BLOOD PRESSURE SUPPORT)
PREFILLED_SYRINGE | INTRAVENOUS | Status: AC
  Filled 2022-07-19: qty 10

## 2022-07-19 MED ORDER — BISACODYL 5 MG PO TBEC: 5.0000 mg | DELAYED_RELEASE_TABLET | Freq: Every day | ORAL | Status: DC | PRN

## 2022-07-19 MED ORDER — DIPHENHYDRAMINE HCL 50 MG/ML IJ SOLN
INTRAMUSCULAR | Status: DC | PRN
  Administered 2022-07-19: 12.5 mg via INTRAVENOUS

## 2022-07-19 MED ORDER — 0.9 % SODIUM CHLORIDE (POUR BTL) OPTIME
TOPICAL | Status: DC | PRN
  Administered 2022-07-19: 1000 mL

## 2022-07-19 MED ORDER — PHENYLEPHRINE 80 MCG/ML (10ML) SYRINGE FOR IV PUSH (FOR BLOOD PRESSURE SUPPORT)
PREFILLED_SYRINGE | INTRAVENOUS | Status: DC | PRN
  Administered 2022-07-19 (×4): 80 ug via INTRAVENOUS

## 2022-07-19 MED ORDER — PROPOFOL 1000 MG/100ML IV EMUL
INTRAVENOUS | Status: AC
  Filled 2022-07-19: qty 100

## 2022-07-19 MED ORDER — ACETAMINOPHEN 325 MG PO TABS
650.0000 mg | ORAL_TABLET | ORAL | Status: DC | PRN
  Administered 2022-07-19 – 2022-07-20 (×3): 650 mg via ORAL
  Filled 2022-07-19 (×3): qty 2

## 2022-07-19 MED ORDER — FENTANYL CITRATE (PF) 250 MCG/5ML IJ SOLN
INTRAMUSCULAR | Status: DC | PRN
  Administered 2022-07-19: 100 ug via INTRAVENOUS
  Administered 2022-07-19: 25 ug via INTRAVENOUS
  Administered 2022-07-19: 50 ug via INTRAVENOUS
  Administered 2022-07-19: 25 ug via INTRAVENOUS
  Administered 2022-07-19: 50 ug via INTRAVENOUS

## 2022-07-19 MED ORDER — GABAPENTIN 300 MG PO CAPS: 300.0000 mg | ORAL_CAPSULE | Freq: Three times a day (TID) | ORAL | Status: DC | PRN

## 2022-07-19 MED ORDER — LIDOCAINE 2% (20 MG/ML) 5 ML SYRINGE
INTRAMUSCULAR | Status: DC | PRN
  Administered 2022-07-19: 60 mg via INTRAVENOUS

## 2022-07-19 MED ORDER — LOSARTAN POTASSIUM 50 MG PO TABS
25.0000 mg | ORAL_TABLET | Freq: Every day | ORAL | Status: DC
  Administered 2022-07-19 – 2022-07-20 (×2): 25 mg via ORAL
  Filled 2022-07-19 (×2): qty 1

## 2022-07-19 MED ORDER — DIPHENHYDRAMINE HCL 50 MG/ML IJ SOLN
INTRAMUSCULAR | Status: AC
  Filled 2022-07-19: qty 1

## 2022-07-19 MED ORDER — DEXMEDETOMIDINE HCL IN NACL 80 MCG/20ML IV SOLN
INTRAVENOUS | Status: AC
  Filled 2022-07-19: qty 20

## 2022-07-19 MED ORDER — OXYCODONE HCL 10 MG PO TABS: 10.0000 mg | ORAL_TABLET | ORAL | 0 refills | Status: AC | PRN

## 2022-07-19 MED ORDER — DEXMEDETOMIDINE HCL IN NACL 80 MCG/20ML IV SOLN
INTRAVENOUS | Status: DC | PRN
  Administered 2022-07-19: 4 ug via BUCCAL
  Administered 2022-07-19: 8 ug via BUCCAL

## 2022-07-19 MED ORDER — FENTANYL CITRATE (PF) 250 MCG/5ML IJ SOLN
INTRAMUSCULAR | Status: AC
  Filled 2022-07-19: qty 5

## 2022-07-19 MED ORDER — MIDAZOLAM HCL 2 MG/2ML IJ SOLN
INTRAMUSCULAR | Status: DC | PRN
  Administered 2022-07-19: 2 mg via INTRAVENOUS

## 2022-07-19 MED ORDER — LACTATED RINGERS IV SOLN: INTRAVENOUS | Status: DC

## 2022-07-19 MED ORDER — POTASSIUM CHLORIDE IN NACL 20-0.45 MEQ/L-% IV SOLN
INTRAVENOUS | Status: DC
  Filled 2022-07-19: qty 1000

## 2022-07-19 MED ORDER — SUCCINYLCHOLINE CHLORIDE 200 MG/10ML IV SOSY
PREFILLED_SYRINGE | INTRAVENOUS | Status: DC | PRN
  Administered 2022-07-19: 140 mg via INTRAVENOUS

## 2022-07-19 MED ORDER — ONDANSETRON HCL 4 MG/2ML IJ SOLN: 4.0000 mg | Freq: Four times a day (QID) | INTRAMUSCULAR | Status: DC | PRN

## 2022-07-19 MED ORDER — HYDROMORPHONE HCL 1 MG/ML IJ SOLN
0.2500 mg | INTRAMUSCULAR | Status: DC | PRN
  Administered 2022-07-19 (×4): 0.5 mg via INTRAVENOUS

## 2022-07-19 MED ORDER — METHOCARBAMOL 750 MG PO TABS: 750.0000 mg | ORAL_TABLET | Freq: Three times a day (TID) | ORAL | 1 refills | Status: AC | PRN

## 2022-07-19 MED ORDER — INSULIN ASPART 100 UNIT/ML IJ SOLN
0.0000 [IU] | INTRAMUSCULAR | Status: AC | PRN
  Administered 2022-07-19: 2 [IU] via SUBCUTANEOUS
  Administered 2022-07-19: 4 [IU] via SUBCUTANEOUS

## 2022-07-19 MED ORDER — TRANEXAMIC ACID-NACL 1000-0.7 MG/100ML-% IV SOLN
1000.0000 mg | INTRAVENOUS | Status: AC
  Administered 2022-07-19: 1000 mg via INTRAVENOUS
  Filled 2022-07-19: qty 100

## 2022-07-19 MED ORDER — METOPROLOL TARTRATE 50 MG PO TABS: 50.0000 mg | ORAL_TABLET | Freq: Every day | ORAL | Status: DC

## 2022-07-19 MED ORDER — CEFAZOLIN IN SODIUM CHLORIDE 3-0.9 GM/100ML-% IV SOLN
3.0000 g | Freq: Three times a day (TID) | INTRAVENOUS | Status: AC
  Administered 2022-07-19 (×2): 3 g via INTRAVENOUS
  Filled 2022-07-19 (×2): qty 100

## 2022-07-19 MED ORDER — INSULIN ASPART 100 UNIT/ML IJ SOLN
0.0000 [IU] | Freq: Three times a day (TID) | INTRAMUSCULAR | Status: DC
  Administered 2022-07-19: 3 [IU] via SUBCUTANEOUS
  Administered 2022-07-19: 2 [IU] via SUBCUTANEOUS

## 2022-07-19 MED ORDER — POLYETHYLENE GLYCOL 3350 17 G PO PACK: 17.0000 g | PACK | Freq: Every day | ORAL | 0 refills | Status: AC

## 2022-07-19 MED ORDER — ONDANSETRON HCL 4 MG PO TABS: 4.0000 mg | ORAL_TABLET | Freq: Four times a day (QID) | ORAL | Status: DC | PRN

## 2022-07-19 MED ORDER — PROPOFOL 10 MG/ML IV BOLUS
INTRAVENOUS | Status: DC | PRN
  Administered 2022-07-19: 200 mg via INTRAVENOUS

## 2022-07-19 MED ORDER — ONDANSETRON HCL 4 MG/2ML IJ SOLN
INTRAMUSCULAR | Status: AC
  Filled 2022-07-19: qty 2

## 2022-07-19 MED ORDER — MIDAZOLAM HCL 2 MG/2ML IJ SOLN
INTRAMUSCULAR | Status: AC
  Filled 2022-07-19: qty 2

## 2022-07-19 MED ORDER — BUPIVACAINE-EPINEPHRINE (PF) 0.25% -1:200000 IJ SOLN
INTRAMUSCULAR | Status: DC | PRN
  Administered 2022-07-19: 6 mL

## 2022-07-19 MED ORDER — CHLORHEXIDINE GLUCONATE 0.12 % MT SOLN
15.0000 mL | Freq: Once | OROMUCOSAL | Status: AC
  Administered 2022-07-19: 15 mL via OROMUCOSAL
  Filled 2022-07-19: qty 15

## 2022-07-19 MED ORDER — BUPIVACAINE-EPINEPHRINE (PF) 0.25% -1:200000 IJ SOLN
INTRAMUSCULAR | Status: AC
  Filled 2022-07-19: qty 30

## 2022-07-19 MED ORDER — MAGNESIUM CITRATE PO SOLN: 1.0000 | Freq: Once | ORAL | Status: DC | PRN

## 2022-07-19 MED ORDER — THROMBIN 20000 UNITS EX SOLR
CUTANEOUS | Status: AC
  Filled 2022-07-19: qty 20000

## 2022-07-19 MED ORDER — ORAL CARE MOUTH RINSE: 15.0000 mL | Freq: Once | OROMUCOSAL | Status: AC

## 2022-07-19 MED ORDER — OXYCODONE HCL 5 MG PO TABS
10.0000 mg | ORAL_TABLET | ORAL | Status: DC | PRN
  Administered 2022-07-19 – 2022-07-20 (×5): 10 mg via ORAL
  Filled 2022-07-19 (×5): qty 2

## 2022-07-19 MED ORDER — INSULIN ASPART 100 UNIT/ML IJ SOLN: 0.0000 [IU] | Freq: Every day | INTRAMUSCULAR | Status: DC

## 2022-07-19 MED ORDER — ROCURONIUM BROMIDE 10 MG/ML (PF) SYRINGE
PREFILLED_SYRINGE | INTRAVENOUS | Status: DC | PRN
  Administered 2022-07-19: 15 mg via INTRAVENOUS
  Administered 2022-07-19 (×4): 10 mg via INTRAVENOUS
  Administered 2022-07-19: 65 mg via INTRAVENOUS

## 2022-07-19 SURGICAL SUPPLY — 58 items
BAG COUNTER SPONGE SURGICOUNT (BAG) ×1 IMPLANT
BAG DECANTER FOR FLEXI CONT (MISCELLANEOUS) IMPLANT
BAG SPNG CNTER NS LX DISP (BAG) ×1
BAND INSRT 18 STRL LF DISP RB (MISCELLANEOUS)
BAND RUBBER #18 3X1/16 STRL (MISCELLANEOUS) ×2 IMPLANT
BUR EGG ELITE 5.0 (BURR) IMPLANT
BUR RND DIAMOND ELITE 4.0 (BURR) IMPLANT
CLEANER TIP ELECTROSURG 2X2 (MISCELLANEOUS) ×1 IMPLANT
CNTNR URN SCR LID CUP LEK RST (MISCELLANEOUS) ×1 IMPLANT
CONT SPEC 4OZ STRL OR WHT (MISCELLANEOUS) ×1
DRAPE LAPAROTOMY 100X72X124 (DRAPES) ×1 IMPLANT
DRAPE MICROSCOPE SLANT 54X150 (MISCELLANEOUS) ×1 IMPLANT
DRAPE SHEET LG 3/4 BI-LAMINATE (DRAPES) ×1 IMPLANT
DRAPE SURG 17X11 SM STRL (DRAPES) ×1 IMPLANT
DRAPE UTILITY XL STRL (DRAPES) ×1 IMPLANT
DRSG AQUACEL AG ADV 3.5X 4 (GAUZE/BANDAGES/DRESSINGS) IMPLANT
DRSG AQUACEL AG ADV 3.5X 6 (GAUZE/BANDAGES/DRESSINGS) IMPLANT
DRSG TELFA 3X8 NADH STRL (GAUZE/BANDAGES/DRESSINGS) IMPLANT
DURAPREP 26ML APPLICATOR (WOUND CARE) ×1 IMPLANT
DURASEAL SPINE SEALANT 3ML (MISCELLANEOUS) IMPLANT
ELECT BLADE 4.0 EZ CLEAN MEGAD (MISCELLANEOUS) ×1
ELECT REM PT RETURN 9FT ADLT (ELECTROSURGICAL) ×1
ELECTRODE BLDE 4.0 EZ CLN MEGD (MISCELLANEOUS) IMPLANT
ELECTRODE REM PT RTRN 9FT ADLT (ELECTROSURGICAL) ×1 IMPLANT
GLOVE BIOGEL PI IND STRL 7.5 (GLOVE) ×1 IMPLANT
GLOVE SURG SS PI 7.0 STRL IVOR (GLOVE) ×1 IMPLANT
GLOVE SURG SS PI 8.0 STRL IVOR (GLOVE) ×2 IMPLANT
GOWN STRL REUS W/ TWL LRG LVL3 (GOWN DISPOSABLE) ×1 IMPLANT
GOWN STRL REUS W/ TWL XL LVL3 (GOWN DISPOSABLE) ×1 IMPLANT
GOWN STRL REUS W/TWL LRG LVL3 (GOWN DISPOSABLE) ×1
GOWN STRL REUS W/TWL XL LVL3 (GOWN DISPOSABLE) ×1
IV CATH 14GX2 1/4 (CATHETERS) ×1 IMPLANT
KIT BASIN OR (CUSTOM PROCEDURE TRAY) ×1 IMPLANT
NDL 22X1.5 STRL (OR ONLY) (MISCELLANEOUS) ×1 IMPLANT
NDL SPNL 18GX3.5 QUINCKE PK (NEEDLE) ×2 IMPLANT
NEEDLE 22X1.5 STRL (OR ONLY) (MISCELLANEOUS) ×1 IMPLANT
NEEDLE SPNL 18GX3.5 QUINCKE PK (NEEDLE) ×2 IMPLANT
PACK LAMINECTOMY NEURO (CUSTOM PROCEDURE TRAY) ×1 IMPLANT
PATTIES SURGICAL .75X.75 (GAUZE/BANDAGES/DRESSINGS) ×1 IMPLANT
SOLUTION PRONTOSAN WOUND 350ML (IRRIGATION / IRRIGATOR) IMPLANT
SPONGE SURGIFOAM ABS GEL 100 (HEMOSTASIS) ×1 IMPLANT
SPONGE T-LAP 4X18 ~~LOC~~+RFID (SPONGE) IMPLANT
STAPLER VISISTAT (STAPLE) IMPLANT
STRIP CLOSURE SKIN 1/2X4 (GAUZE/BANDAGES/DRESSINGS) ×1 IMPLANT
SUT NURALON 4 0 TR CR/8 (SUTURE) IMPLANT
SUT PROLENE 3 0 PS 2 (SUTURE) IMPLANT
SUT VIC AB 1 CT1 27 (SUTURE)
SUT VIC AB 1 CT1 27XBRD ANTBC (SUTURE) IMPLANT
SUT VIC AB 1-0 CT2 27 (SUTURE) IMPLANT
SUT VIC AB 2-0 CT1 27 (SUTURE)
SUT VIC AB 2-0 CT1 TAPERPNT 27 (SUTURE) IMPLANT
SUT VIC AB 2-0 CT2 27 (SUTURE) IMPLANT
SYR 3ML LL SCALE MARK (SYRINGE) ×1 IMPLANT
TOWEL GREEN STERILE (TOWEL DISPOSABLE) ×1 IMPLANT
TOWEL GREEN STERILE FF (TOWEL DISPOSABLE) ×1 IMPLANT
TRAY FOLEY MTR SLVR 16FR STAT (SET/KITS/TRAYS/PACK) ×1 IMPLANT
WIPE CHG 2% 2PK PREOPERATIVE (MISCELLANEOUS) ×1 IMPLANT
YANKAUER SUCT BULB TIP NO VENT (SUCTIONS) ×1 IMPLANT

## 2022-07-19 NOTE — Brief Op Note (Signed)
07/19/2022  7:30 AM  PATIENT:  Jonny Ruiz  27 y.o. male  PRE-OPERATIVE DIAGNOSIS:  Herniated disc L4-5 left  POST-OPERATIVE DIAGNOSIS:  * No post-op diagnosis entered *  PROCEDURE:  Procedure(s) with comments: Microdiscectomy L4-5 left (Left) - 3 C-Bed  SURGEON:  Surgeon(s) and Role:    Susa Day, MD - Primary  PHYSICIAN ASSISTANT:   ASSISTANTS: [place]   ANESTHESIA:   general  EBL:  25   BLOOD ADMINISTERED:none  DRAINS: none   LOCAL MEDICATIONS USED:  MARCAINE     SPECIMEN:  No Specimen  DISPOSITION OF SPECIMEN:  N/A  COUNTS:  YES  TOURNIQUET:  * No tourniquets in log *  DICTATION: .Other Dictation: Dictation Number [id number]   PLAN OF CARE: Admit for overnight observation  PATIENT DISPOSITION:  PACU - hemodynamically stable.   Delay start of Pharmacological VTE agent (>24hrs) due to surgical blood loss or risk of bleeding: yes

## 2022-07-19 NOTE — Consult Note (Signed)
Initial Consultation Note   Patient: Miguel Sims [id number] DOB: 1995-12-31 PCP: Aletha Halim., PA-C DOA: 07/19/2022 DOS: the patient was seen and examined on 07/19/2022 Primary service: Susa Day, MD    Reason for consult: Diabetes mellitus type 2  Assessment/Plan: Assessment and Plan:  Herniated disc of L4-L5 S/p microdiscectomy with Dr. Tonita Cong  Diabetes mellitus type II, without long-term use of insulin Patient reports blood sugars in the 1 30-1 80 range at home.  Most recent hemoglobin A1c however noted to be elevated at 9.9.  He notes recent steroid injections due to issues with his back.  He had recently been -Hypoglycemic protocols -Check hemoglobin A1c -CBGs before every meal with moderate SSI -Recommend continuing increased dose of metformin extended release 1000 mg twice daily -Prescription for glipizide 5 mg p.o. daily added to medication list to be sent to patient's pharmacy -Recommend patient to check blood sugars regularly and keep log of blood sugar  -Patient should follow-up with his primary care provider for further management.   Hypertension Hyperlipidemia -Continue current medication  TRH will sign off at present, please call us again when needed.  HPI: Miguel Sims is a 27 y.o. male with past medical history of hypertension, diabetes mellitus type 2, ADHD, and smokeless tobacco use who presents today for surgery with Dr. Tonita Cong after being diagnosed with herniated disks of L4-L5 on the left.  The underwent microdissection of L4-L5 on the left.  He had previously gotten a steroid injection in his back about a month ago.  Review of care everywhere records note most recent hemoglobin A1c was 9.9.  He had followed up with his primary care provider last week and dose of metformin was increased from 500 mg twice daily to 1000 mg twice daily.  At home patient reports that blood sugars range from 130s to 180s while at home.  Review of Systems: As mentioned  in the history of present illness. All other systems reviewed and are negative. Past Medical History:  Diagnosis Date   ADHD (attention deficit hyperactivity disorder)    Allergic rhinitis    Anxiety    Chest pain    Diabetes mellitus without complication    Diaphoresis    Dizziness    Hypertension    Hypertension    Ingrown nail of great toe of left foot    Nausea    Smoker    SOB (shortness of breath)    Syncope and collapse    Past Surgical History:  Procedure Laterality Date   arm surgery     NOSE SURGERY  2012   NOSE SURGERY  2013   TESTICLE SURGERY  2008   Social History:  reports that he has quit smoking. His smokeless tobacco use includes chew and snuff. He reports that he does not currently use alcohol. He reports that he does not currently use drugs.  Allergies  Allergen Reactions   Penicillin G     Other reaction(s): Other (See Comments) Unknown reaction    Family History  Problem Relation Age of Onset   Heart disease Father     Prior to Admission medications   Medication Sig Start Date End Date Taking? Authorizing Provider  docusate sodium (COLACE) 100 MG capsule Take 1 capsule (100 mg total) by mouth 2 (two) times daily as needed for mild constipation. 07/19/22  Yes Susa Day, MD  gabapentin (NEURONTIN) 300 MG capsule Take 300 mg by mouth 3 (three) times daily as needed (pain). 05/02/22  Yes [provider]  losartan (COZAAR) 25 MG tablet Take 25 mg by mouth daily. 11/26/17  Yes [provider]  metFORMIN (GLUCOPHAGE-XR) 500 MG 24 hr tablet Take 500 mg by mouth 2 (two) times daily with a meal. 07/01/19  Yes [provider]  methocarbamol (ROBAXIN) 750 MG tablet Take 1 tablet (750 mg total) by mouth every 8 (eight) hours as needed for muscle spasms (muscle spasms). 07/19/22  Yes Susa Day, MD  Oxycodone HCl 10 MG TABS Take 1 tablet (10 mg total) by mouth every 4 (four) hours as needed for up to 7 days. 07/19/22 07/26/22 Yes Susa Day, MD  polyethylene glycol (MIRALAX / GLYCOLAX) 17 g packet Take 17 g by mouth daily. 07/19/22  Yes Susa Day, MD  rosuvastatin (CRESTOR) 20 MG tablet Take 20 mg by mouth daily. 04/02/22  Yes [provider]  metoprolol tartrate (LOPRESSOR) 50 MG tablet Take one tablet by mouth the night before CT and take one tablet by mouth the day of your CT, 2 hours prior to the CT Patient not taking: Reported on 07/12/2022 08/01/18   Josue Hector, MD    Physical Exam: Vitals:   07/19/22 0550 07/19/22 1030  BP: (!) 165/91 (!) 122/92  Pulse: 92 76  Resp: 18 18  Temp: 98 F (36.7 C) (!) 96.9 F (36.1 C)  TempSrc: Oral   SpO2: 98% 97%  Weight: 131.5 kg   Height: 6\' 5"  (1.956 m)    Young male currently walking the hall in no acute distress with a walker. Data Reviewed:   Reviewed labs and pertinent records as noted above.   Primary team communication:  Thank you very much for involving [place] in the care of your patient.  Author: Norval Morton, MD 07/19/2022 10:36 AM  For on call review [email or web address].

## 2022-07-19 NOTE — Transfer of Care (Signed)
Immediate Anesthesia Transfer of Care Note  Patient: Miguel Sims  Procedure(s) Performed: Microdiscectomy L4-5 left (Left)  Patient Location: PACU  Anesthesia Type:General  Level of Consciousness: drowsy and patient cooperative  Airway & Oxygen Therapy: Patient Spontanous Breathing and Patient connected to face mask oxygen  Post-op Assessment: Report given to RN and Post -op Vital signs reviewed and stable  Post vital signs: Reviewed and stable  Last Vitals:  Vitals Value Taken Time  BP 122/92 07/19/22 1030  Temp    Pulse 72 07/19/22 1034  Resp 24 07/19/22 1034  SpO2 98 % 07/19/22 1034  Vitals shown include unvalidated device data.  Last Pain:  Vitals:   07/19/22 0606  TempSrc:   PainSc: 7       Patients Stated Pain Goal: 3 ([id number])  Complications: No notable events documented.

## 2022-07-19 NOTE — Op Note (Signed)
NAME: Miguel Sims, Miguel Sims MEDICAL RECORD NO: [id number] ACCOUNT NO: [id number] DATE OF BIRTH: 1995/11/16 FACILITY: [facility] LOCATION: [facility] PHYSICIAN: Johnn Hai, MD  Operative Report   DATE OF PROCEDURE: 07/19/2022  PREOPERATIVE DIAGNOSIS:  Spinal stenosis, HNP, L4-L5, left.  POSTOPERATIVE DIAGNOSES: 1.  Spinal stenosis, HNP, L4-L5, left. 2.  Extensive epidural venous plexus.  PROCEDURES PERFORMED:  1.  Left hemilaminotomy with foraminotomies, L4-L5. 2.  Microdiskectomy L4-L5, left. 3.  Lysis of extensive epidural venous plexus L4-L5, left.  ANESTHESIA:  General.  ASSISTANT:  Lacie Draft, PA.  HISTORY:  This is a 27 year old male with progressive left lower extremity radicular pain and a progressive foot drop on the left secondary to HNP at L4-L5 to the left compressing the L5 nerve root.  He was indicated for decompression at L4-L5 by  microdiskectomy and lateral recess decompression and he has spinal stenosis as well.  Risks and benefits were discussed including bleeding, infection, damage to neurovascular structures, no change in symptoms, worsening symptoms, DVT, PE, anesthetic  complications, etc.  DESCRIPTION OF PROCEDURE:  With the patient in supine position, after induction of adequate general anesthesia, and 3 grams Kefzol, he was placed prone on the Wilson frame.  All bony prominences well padded.  Lumbar region was prepped and draped in the  usual sterile fashion.  Two 18-gauge spinal needles were utilized to localize L4-L5 interspace, confirmed with x-ray.  Incision was made from the spinous process of 4-5.  Subcutaneous tissue was dissected.  Electrocautery was utilized to achieve  hemostasis.  Dorsal lumbar fascia divided on skin incision.  Paraspinous muscle elevated from lamina L4 and L5.  McCulloch retractor was placed.  Operating microscope was draped and brought in the surgical field after confirmatory radiograph obtained.  A  high-speed bur was utilized to perform  a hemilaminotomy of the caudad edge of 4 preserving ample pars.  He had a very small interlaminar window.  A straight curette was utilized to detach ligamentum flavum from the cephalad edge of the superior  articulating process of 5.  I used a Penfield 4 to enter the epidural space, protecting the neural elements.  I decompressed the lateral recess to medial border of the pedicle and performed a foraminotomy of L5.  A neuro patty was placed beneath the  ligamentum flavum.  I removed ligamentum flavum from the interspace.  There was an extensive epidural venous plexus tethering the L5 root, extending along the lateral recess.  We used electrocautery, bipolar to divide and cauterize the epidural venous  plexus, mobilizing the L5 root, which was gently retracted with a D'Errico, identifying a focal HNP at L4-L5.  Annulotomy was performed and I removed two large extruded fragments subannular.  Additional fragments were retrieved with a micropituitary and  further mobilized with Epstein, preserving the endplates.  Multiple passes were made, and excising multiple fragments.  It was copiously irrigated with catheter lavage and then additional fragments were retrieved.  Venous plexus cephalad was noted as  well and cauterized.  I obtained a confirmatory radiograph with a Penfield in the interspace.  I then used an upbiting micropituitary as well.  I placed thrombin-soaked Gelfoam in the lateral recess.  Following the decompression, the foraminotomy, the  nerve root was erythematous and edematous, but intact.  1 cm of excursion of the L5 root medial pedicle without tension.  Copiously irrigated once again.  There was no evidence of CSF leakage or active bleeding. Thrombin-soaked Gelfoam was removed.  I  checked the shoulder, the root,  the axilla foramen of 4 and 5 and beneath thecal sac, no residual fragments were noted.  I performed a foraminotomy of L4 as well.  Next, I removed the McCulloch retractor, irrigated  the paraspinous musculature.  No active bleeding.  Closed the dorsal lumbar fascia with 1 Vicryl in interrupted figure-of-eight sutures, subcutaneous with 2-0 and skin with subcuticular Prolene.  Sterile  dressing was applied.  Placed supine on the hospital bed, extubated without difficulty and transported to the recovery room in satisfactory condition.  The patient tolerated the procedure well.  No complications.  ASSISTANT:  Lacie Draft, PA, was used throughout the case for patient positioning, general, intermittent neural retraction and closure.  BLOOD LOSS:  25 mL.  Noted intraoperatively, the microscope required redraping and reconfiguration prior to bringing the scope in. In addition, micro pituitaries, which were not in the room, were retrieved.   MUK D: 07/19/2022 10:19:37 am T: 07/19/2022 10:30:00 am  JOB: [id number]

## 2022-07-19 NOTE — Interval H&P Note (Signed)
History and Physical Interval Note:  07/19/2022 7:26 AM  Jonny Ruiz  has presented today for surgery, with the diagnosis of Herniated disc L4-5 left.  The various methods of treatment have been discussed with the patient and family. After consideration of risks, benefits and other options for treatment, the patient has consented to  Procedure(s) with comments: Microdiscectomy L4-5 left (Left) - 3 C-Bed as a surgical intervention.  The patient's history has been reviewed, patient examined, no change in status, stable for surgery.  I have reviewed the patient's chart and labs.  Questions were answered to the patient's satisfaction.    Exam reveals a positive straight leg raise on the left.  Diminished sensation L5 dermatome on and 3/5 dorsiflexion which is a progressive worsening in his weakness.   Johnn Hai  Patient under preoperative elevated A1c.  He was seen by his medical physician.  His metformin was doubled.  And his home blood glucose levels have been in the range of 130-170. I contacted the hospitalist on-call yesterday and spoke with him about this patient.  The hospitalist felt that with the change in his diabetic medication and his improvement in blood glucose that it would be fine to proceed with surgical intervention.  Patient was also cleared by his medical physician.  He has had a progressive weakness in the left foot. On examination today he in fact has increased weakness in dorsiflexion of the left foot.  And diminished sensation in the L5 dermatome.  We discussed proceeding with surgery with strict blood glucose control perioperatively and postoperatively.   In addition I discussed with the hospitalist consultation today postoperatively for any additional input that they may provide.

## 2022-07-19 NOTE — Anesthesia Procedure Notes (Signed)
Procedure Name: Intubation Date/Time: 07/19/2022 7:43 AM  Performed by: Janene Harvey, CRNAPre-anesthesia Checklist: Patient identified, Emergency Drugs available, Suction available and Patient being monitored Patient Re-evaluated:Patient Re-evaluated prior to induction Oxygen Delivery Method: Circle system utilized Preoxygenation: Pre-oxygenation with 100% oxygen Induction Type: IV induction and Rapid sequence Ventilation: Mask ventilation without difficulty Laryngoscope Size: Mac and 4 Grade View: Grade II Tube type: Oral Tube size: 7.5 mm Number of attempts: 1 Airway Equipment and Method: Stylet and Oral airway Placement Confirmation: ETT inserted through vocal cords under direct vision, positive ETCO2 and breath sounds checked- equal and bilateral Secured at: 23 cm Tube secured with: Tape Dental Injury: Teeth and Oropharynx as per pre-operative assessment

## 2022-07-19 NOTE — Anesthesia Postprocedure Evaluation (Signed)
Anesthesia Post Note  Patient: Miguel Sims  Procedure(s) Performed: Microdiscectomy L4-5 left (Left)     Patient location during evaluation: PACU Anesthesia Type: General Level of consciousness: awake and alert Pain management: pain level controlled Vital Signs Assessment: post-procedure vital signs reviewed and stable Respiratory status: spontaneous breathing, nonlabored ventilation and respiratory function stable Cardiovascular status: blood pressure returned to baseline and stable Postop Assessment: no apparent nausea or vomiting Anesthetic complications: no  No notable events documented.  Last Vitals:  Vitals:   07/19/22 1045 07/19/22 1100  BP: 130/73 131/86  Pulse: 70 82  Resp: 19 17  Temp:    SpO2: 99% 94%    Last Pain:  Vitals:   07/19/22 1100  TempSrc:   PainSc: 0-No pain                 Zoeann Mol,W. EDMOND

## 2022-07-19 NOTE — Discharge Instructions (Addendum)
Walk As Tolerated utilizing back precautions.  No bending, twisting, or lifting.  No driving for 2 weeks.   Aquacel dressing may remain in place until follow up. May shower with aquacel dressing in place. If the dressing peels off or becomes saturated, you may remove aquacel dressing and place gauze and tape dressing which should be kept clean and dry and changed daily. Do not remove steri-strips if they are present. See Dr. Shelle Iron in office in 10 to 14 days. Begin taking aspirin 81mg  per day starting 4 days after your surgery if not allergic to aspirin or on another blood thinner. Walk daily even outside. Use a cane or walker only if necessary. Avoid sitting on soft sofas.  Follow up with your primary care provider regarding diabetic control

## 2022-07-20 ENCOUNTER — Encounter (HOSPITAL_COMMUNITY): Payer: Self-pay | Admitting: Specialist

## 2022-07-20 DIAGNOSIS — M48061 Spinal stenosis, lumbar region without neurogenic claudication: Secondary | ICD-10-CM | POA: Diagnosis not present

## 2022-07-20 LAB — BASIC METABOLIC PANEL
Anion gap: 10 (ref 5–15)
BUN: 10 mg/dL (ref 6–20)
CO2: 25 mmol/L (ref 22–32)
Calcium: 8.6 mg/dL — ABNORMAL LOW (ref 8.9–10.3)
Chloride: 99 mmol/L (ref 98–111)
Creatinine, Ser: 0.94 mg/dL (ref 0.61–1.24)
GFR, Estimated: >60 mL/min (ref >60)
Glucose, Bld: 169 mg/dL — ABNORMAL HIGH (ref 70–99)
Potassium: 3.7 mmol/L (ref 3.5–5.1)
Sodium: 134 mmol/L — ABNORMAL LOW (ref 135–145)

## 2022-07-20 LAB — SURGICAL PATHOLOGY

## 2022-07-20 LAB — HEMOGLOBIN A1C
Hgb A1c MFr Bld: 9.4 % — ABNORMAL HIGH (ref 4.8–5.6)
Mean Plasma Glucose: 223 mg/dL

## 2022-07-20 LAB — GLUCOSE, CAPILLARY
Glucose-Capillary: 158 mg/dL — ABNORMAL HIGH (ref 70–99)
Glucose-Capillary: 68 mg/dL — ABNORMAL LOW (ref 70–99)

## 2022-07-20 MED ORDER — GLIPIZIDE 5 MG PO TABS: 5.0000 mg | ORAL_TABLET | Freq: Every day | ORAL | 0 refills | Status: AC

## 2022-07-20 MED ORDER — METFORMIN HCL ER 500 MG PO TB24: 1000.0000 mg | ORAL_TABLET | Freq: Two times a day (BID) | ORAL | Status: AC

## 2022-07-20 NOTE — Progress Notes (Signed)
Patient alert and oriented, mae's well, voiding adequate amount of urine, swallowing without difficulty, no c/o pain at time of discharge. Patient discharged home with family. Script and discharged instructions given to patient. Patient and family stated understanding of instructions given. Patient has an appointment with Dr. Beane ?

## 2022-07-20 NOTE — Discharge Summary (Signed)
Physician Discharge Summary   Patient ID: Miguel SarnaDavid S Sims MRN: [id number] DOB/AGE: 1995-05-30 27 y.o.  Admit date: 07/19/2022 Discharge date: 07/20/2022  Primary Diagnosis:   Herniated disc L4-5 left  Admission Diagnoses:  Past Medical History:  Diagnosis Date   ADHD (attention deficit hyperactivity disorder)    Allergic rhinitis    Anxiety    Chest pain    Diabetes mellitus without complication    Diaphoresis    Dizziness    Hypertension    Hypertension    Ingrown nail of great toe of left foot    Nausea    Smoker    SOB (shortness of breath)    Syncope and collapse    Discharge Diagnoses:   Principal Problem:   HNP (herniated nucleus pulposus), lumbar  Procedure:  Procedure(s) (LRB): Microdiscectomy L4-5 left (Left)   Consults: None  HPI:  See H&P    Laboratory Data: Hospital Outpatient Visit on 07/17/2022  Component Date Value Ref Range Status   Glucose-Capillary 07/17/2022 207 (H)  70 - 99 mg/dL Final   Glucose reference range applies only to samples taken after fasting for at least 8 hours.   MRSA, PCR 07/17/2022 NEGATIVE  NEGATIVE Final   Staphylococcus aureus 07/17/2022 POSITIVE (A)  NEGATIVE Final   Comment: (NOTE) The Xpert SA Assay (FDA approved for NASAL specimens in patients 29 years of age and older), is one component of a comprehensive surveillance program. It is not intended to diagnose infection nor to guide or monitor treatment. Performed at [facility], [address] [place], [place] [address]    No results for input(s): "HGB" in the last 72 hours. No results for input(s): "WBC", "RBC", "HCT", "PLT" in the last 72 hours. No results for input(s): "NA", "K", "CL", "CO2", "BUN", "CREATININE", "GLUCOSE", "CALCIUM" in the last 72 hours. No results for input(s): "LABPT", "INR" in the last 72 hours.  X-Rays:DG Lumbar Spine 2-3 Views  Result Date: 07/19/2022 CLINICAL DATA:  Elective surgery. EXAM: LUMBAR SPINE - 2-3 VIEW COMPARISON:   Radiographs 07/17/2022 demonstrating 5 non-rib-bearing lumbar vertebra FINDINGS: Three portable cross-table lateral views of the lumbar spine obtained in the operating room. Image 1 demonstrates surgical instruments localizing posterior to the L4 and L5 spinous processes. Image 2 demonstrates surgical instrument localizing posterior projecting over the L5 spinous process. Image 3 demonstrates surgical instruments localizing posteriorly at the posterior aspect of the L4-L5 disc space. IMPRESSION: Intraoperative localization as described above. Electronically Signed   By: Narda RutherfordMelanie  Sanford M.D.   On: 07/19/2022 10:28   DG Lumbar Spine 2-3 Views  Result Date: 07/18/2022 CLINICAL DATA:  Preop discectomy EXAM: LUMBAR SPINE - 2 VIEW COMPARISON:  None Available. FINDINGS: There is no evidence of lumbar spine fracture. Alignment is normal. Intervertebral disc spaces are maintained. IMPRESSION: Negative. Electronically Signed   By: Layla MawJoshua  Pleasure M.D.   On: 07/18/2022 13:43    EKG: Orders placed or performed during the hospital encounter of 07/17/22   EKG 12 lead per protocol   EKG 12 lead per protocol     Hospital Course: [facility] and taken to the OR and underwent the above state procedure without complications.  Patient tolerated the procedure well and was later transferred to the recovery room and then to the orthopaedic floor for postoperative care.  They were given PO and IV analgesics for pain control following their surgery.  They were given 24 hours of postoperative antibiotics.   PT was consulted postop  to assist with mobility and transfers.  The patient was allowed to be WBAT with therapy and was taught back precautions. Discharge planning was consulted to help with postop disposition and equipment needs.  Patient had a good night on the evening of surgery and started to get up OOB with therapy on day one. Patient was seen in rounds and was ready to go home on day  one.  They were given discharge instructions and dressing directions.  They were instructed on when to follow up in the office with Dr. Shelle IronBeane.   Diet: Diabetic diet Activity:WBAT, lumbar precautions Follow-up:in 10-14 days Disposition - Home Discharged Condition: good   Discharge Instructions     Call MD / Call 911   Complete by: As directed    If you experience chest pain or shortness of breath, CALL 911 and be transported to the hospital emergency room.  If you develope a fever above 101 F, pus (white drainage) or increased drainage or redness at the wound, or calf pain, call your surgeon's office.   Constipation Prevention   Complete by: As directed    Drink plenty of fluids.  Prune juice may be helpful.  You may use a stool softener, such as Colace (over the counter) 100 mg twice a day.  Use MiraLax (over the counter) for constipation as needed.   Diet - low sodium heart healthy   Complete by: As directed    Increase activity slowly as tolerated   Complete by: As directed    Post-operative opioid taper instructions:   Complete by: As directed    POST-OPERATIVE OPIOID TAPER INSTRUCTIONS: It is important to wean off of your opioid medication as soon as possible. If you do not need pain medication after your surgery it is ok to stop day one. Opioids include: Codeine, Hydrocodone(Norco, Vicodin), Oxycodone(Percocet, oxycontin) and hydromorphone amongst others.  Long term and even short term use of opiods can cause: Increased pain response Dependence Constipation Depression Respiratory depression And more.  Withdrawal symptoms can include Flu like symptoms Nausea, vomiting And more Techniques to manage these symptoms Hydrate well Eat regular healthy meals Stay active Use relaxation techniques(deep breathing, meditating, yoga) Do Not substitute Alcohol to help with tapering If you have been on opioids for less than two weeks and do not have pain than it is ok to stop all  together.  Plan to wean off of opioids This plan should start within one week post op of your joint replacement. Maintain the same interval or time between taking each dose and first decrease the dose.  Cut the total daily intake of opioids by one tablet each day Next start to increase the time between doses. The last dose that should be eliminated is the evening dose.         Allergies as of 07/20/2022       Reactions   Penicillin G    Other reaction(s): Other (See Comments) Unknown reaction        Medication List     STOP taking these medications    rosuvastatin 20 MG tablet Commonly known as: CRESTOR       TAKE these medications    docusate sodium 100 MG capsule Commonly known as: Colace Take 1 capsule (100 mg total) by mouth 2 (two) times daily as needed for mild constipation.   gabapentin 300 MG capsule Commonly known as: NEURONTIN Take 300 mg by mouth 3 (three) times daily as needed (pain).   glipiZIDE 5 MG tablet  Commonly known as: Glucotrol Take 1 tablet (5 mg total) by mouth daily.   losartan 25 MG tablet Commonly known as: COZAAR Take 25 mg by mouth daily.   metFORMIN 500 MG 24 hr tablet Commonly known as: GLUCOPHAGE-XR Take 2 tablets (1,000 mg total) by mouth 2 (two) times daily with a meal. What changed: how much to take   methocarbamol 750 MG tablet Commonly known as: ROBAXIN Take 1 tablet (750 mg total) by mouth every 8 (eight) hours as needed for muscle spasms (muscle spasms).   metoprolol tartrate 50 MG tablet Commonly known as: LOPRESSOR Take one tablet by mouth the night before CT and take one tablet by mouth the day of your CT, 2 hours prior to the CT   Oxycodone HCl 10 MG Tabs Take 1 tablet (10 mg total) by mouth every 4 (four) hours as needed for up to 7 days.   polyethylene glycol 17 g packet Commonly known as: MIRALAX / GLYCOLAX Take 17 g by mouth daily.        Follow-up Information     Jene Every, MD Follow up in 2  week(s).   Specialty: Orthopedic Surgery Contact information: [address] [place] [address] [place] [address] [phone]                 Signed: Andrez Grime, PA-C Orthopaedic Surgery 07/20/2022, 7:43 AM

## 2022-07-20 NOTE — Evaluation (Signed)
Occupational Therapy Evaluation and DC Summary Patient Details Name: Miguel SarnaDavid S Sims MRN: [id number] DOB: 1996-01-08 Today's Date: 07/20/2022   History of Present Illness Patient is a 27 y/o male who presents on 4/4 with disc herniation on left now s/p microdiscectomy L4-5. PMH includes HTN, DM, ADHD, and smokeless tobacco use.   Clinical Impression   Pt admitted for above diagnosis. Patient presents with back pain post surgery but demonstrates ability to complete functional ambulation with Supervision and bADLs with Min guard to supervision assist. Pt educated on the use of AE to complete lower body dressing and bathing, does require Min A at this time but has assist PRN and demonstrated appropriate use of AE while maintaining precautions. Pt educated on POB precautions and demonstrated ability to perform transfers, dressing, and bathing safely with use of AE prn to maintain precautions. Patient functioning near baseline with use of strategies and AE. No follow-up OT recommended at this time       Recommendations for follow up therapy are one component of a multi-disciplinary discharge planning process, led by the attending physician.  Recommendations may be updated based on patient status, additional functional criteria and insurance authorization.   Assistance Recommended at Discharge PRN  Patient can return home with the following A little help with bathing/dressing/bathroom;Assist for transportation;Assistance with cooking/housework    Functional Status Assessment  Patient has not had a recent decline in their functional status  Equipment Recommendations  Tub/shower seat;Other (comment) (Hip kit)    Recommendations for Other Services       Precautions / Restrictions Precautions Precautions: Back;Fall Precaution Booklet Issued: Yes (comment) (From PT) Precaution Comments: Reviewed handout and precautions Restrictions Weight Bearing Restrictions: No      Mobility Bed Mobility                General bed mobility comments: Pt rec'd and left sitting EOB    Transfers Overall transfer level: Needs assistance Equipment used: Rolling walker (2 wheels) Transfers: Sit to/from Stand Sit to Stand: Modified independent (Device/Increase time)           General transfer comment: Pt demonstrated understanding of car t/f technqiue Max A, discussed having help at this time d/t BLE weakness and decreased LLE sensation      Balance Overall balance assessment: Needs assistance Sitting-balance support: Feet supported, No upper extremity supported Sitting balance-Leahy Scale: Good     Standing balance support: During functional activity Standing balance-Leahy Scale: Poor Standing balance comment: Requires UE support in standing                           ADL either performed or assessed with clinical judgement   ADL Overall ADL's : Needs assistance/impaired Eating/Feeding: Supervision/ safety;Sitting   Grooming: Supervision/safety;Sitting   Upper Body Bathing: Sitting;Supervision/ safety;Set up   Lower Body Bathing: With adaptive equipment;Min guard   Upper Body Dressing : Sitting;Supervision/safety;Set up   Lower Body Dressing: Minimal assistance;Sitting/lateral leans;With adaptive equipment   Toilet Transfer: Supervision/safety;Ambulation;Rolling walker (2 wheels) Toilet Transfer Details (indicate cue type and reason): simulated with small chair x2 Toileting- Clothing Manipulation and Hygiene: Sitting/lateral lean;Minimal assistance   Tub/ Shower Transfer: Ambulation;Min guard;Rolling walker (2 wheels)   Functional mobility during ADLs: Supervision/safety;Rolling walker (2 wheels)       Vision         Perception     Praxis      Pertinent Vitals/Pain Pain Assessment Pain Assessment: 0-10 Pain Score: 7  Pain  Location: Back Pain Descriptors / Indicators: Sore, Operative site guarding, Guarding, Grimacing Pain Intervention(s):  Limited activity within patient's tolerance, Monitored during session     Hand Dominance     Extremity/Trunk Assessment Upper Extremity Assessment Upper Extremity Assessment: Overall WFL for tasks assessed   Lower Extremity Assessment Lower Extremity Assessment: Generalized weakness;LLE deficits/detail LLE Deficits / Details: No sensation from hips down to foot. Quad weakness. LLE Sensation: decreased light touch;decreased proprioception LLE Coordination: decreased fine motor;decreased gross motor   Cervical / Trunk Assessment Cervical / Trunk Assessment: Back Surgery   Communication Communication Communication: No difficulties   Cognition Arousal/Alertness: Awake/alert Behavior During Therapy: WFL for tasks assessed/performed Overall Cognitive Status: Within Functional Limits for tasks assessed                                       General Comments  VSS on RA. Wife present and supportive during session    Exercises     Shoulder Instructions      Home Living Family/patient expects to be discharged to:: Private residence Living Arrangements: Spouse/significant other Available Help at Discharge: Family;Available PRN/intermittently Type of Home: House Home Access: Stairs to enter Enter[employer] CorporationEntrance Stairs-Number of Steps: 4-5 Entrance Stairs-Rails: Right Home Layout: One level     Bathroom Shower/Tub: Producer, television/film/videoWalk-in shower   Bathroom Toilet: Handicapped height     Home Equipment: Cane - quad          Prior Functioning/Environment Prior Level of Function : Independent/Modified Independent             Mobility Comments: Uses quad cane for ambulation for the last 2 months. Has not been working for the last 6 months. Pt hopeful to return to work after recovery ADLs Comments: independent        OT Problem List: Pain;Impaired balance (sitting and/or standing)      OT Treatment/Interventions: Self-care/ADL training;Energy conservation;Therapeutic  exercise;Patient/family education;Therapeutic activities;DME and/or AE instruction;Balance training    OT Goals(Current goals can be found in the care plan section) Acute Rehab OT Goals Patient Stated Goal: To get home OT Goal Formulation: With patient Time For Goal Achievement: 08/03/22 Potential to Achieve Goals: Good  OT Frequency:      Co-evaluation              AM-PAC OT "6 Clicks" Daily Activity     Outcome Measure Help from another person eating meals?: None Help from another person taking care of personal grooming?: None Help from another person toileting, which includes using toliet, bedpan, or urinal?: A Little Help from another person bathing (including washing, rinsing, drying)?: A Little Help from another person to put on and taking off regular upper body clothing?: None Help from another person to put on and taking off regular lower body clothing?: A Little 6 Click Score: 21   End of Session Equipment Utilized During Treatment: Gait belt;Rolling walker (2 wheels) Nurse Communication: Mobility status  Activity Tolerance: Patient tolerated treatment well Patient left: in bed;with family/visitor present  OT Visit Diagnosis: Unsteadiness on feet (R26.81);Pain Pain - part of body:  (Back)                Time: [id number] OT Time Calculation (min): 30 min Charges:  OT General Charges $OT Visit: 1 Visit OT Evaluation $OT Eval Moderate Complexity: 1 Mod OT Treatments $Self Care/Home Management : 8-22 mins  07/20/2022  AB, OTR/L  Acute Rehabilitation Services  Office: [phone]   Tristan Schroeder 07/20/2022, 9:07 AM

## 2022-07-20 NOTE — Progress Notes (Addendum)
Pt requiring DME through his [employer] workers comp. CM has called [phone] and send them an order via email for; walker, BSC, shower seat. They know its needed STAT and are sending it to the case worker to have it processed. The DME will be delivered to the patients home.   [email or web address]

## 2022-07-20 NOTE — Evaluation (Signed)
Physical Therapy Evaluation Patient Details Name: Miguel SarnaDavid S Haug MRN: [id number] DOB: 1995-08-19 Today's Date: 07/20/2022  History of Present Illness  Patient is a 27 y/o male who presents on 4/4 with disc herniation on left now s/p microdiscectomy L4-5. PMH includes HTN, DM, ADHD, and smokeless tobacco use.  Clinical Impression  Patient presents with pain, LLE weakness and post surgical deficits s/p above surgery. Pt lives at home with his wife and reports being mod I for ADLs at baseline and using quad cane for ambulation PTA. Pt has support from family at d/c. Pt tolerated bed mobility, transfers, gait and stair training with supervision-mod I for safety. Education re: back precautions, handout, walking program, log roll technique, positioning, car transfer etc. Pt with left knee instability due to weakness and impaired sensation. Pt does not require further skilled therapy services. All education completed. Discharge from therapy.     Recommendations for follow up therapy are one component of a multi-disciplinary discharge planning process, led by the attending physician.  Recommendations may be updated based on patient status, additional functional criteria and insurance authorization.  Follow Up Recommendations       Assistance Recommended at Discharge PRN  Patient can return home with the following  Help with stairs or ramp for entrance;Assist for transportation;Assistance with cooking/housework    Equipment Recommendations Rolling walker (2 wheels);BSC/3in1  Recommendations for Other Services       Functional Status Assessment Patient has had a recent decline in their functional status and demonstrates the ability to make significant improvements in function in a reasonable and predictable amount of time.     Precautions / Restrictions Precautions Precautions: Back Precaution Booklet Issued: Yes (comment) Precaution Comments: Reviewed handout and  precautions Restrictions Weight Bearing Restrictions: No      Mobility  Bed Mobility Overal bed mobility: Needs Assistance Bed Mobility: Rolling, Sidelying to Sit, Sit to Sidelying Rolling: Modified independent (Device/Increase time) Sidelying to sit: Modified independent (Device/Increase time)     Sit to sidelying: Modified independent (Device/Increase time) General bed mobility comments: HOB flat, no use of rails and cues for log roll technique to simulate home.    Transfers Overall transfer level: Needs assistance Equipment used: Rolling walker (2 wheels) Transfers: Sit to/from Stand Sit to Stand: Modified independent (Device/Increase time)           General transfer comment: Stood from EOB x1 with cues for hand placement/technique. Slow to rise.    Ambulation/Gait Ambulation/Gait assistance: Supervision Gait Distance (Feet): 300 Feet Assistive device: Rolling walker (2 wheels) Gait Pattern/deviations: Step-through pattern, Decreased stride length, Antalgic Gait velocity: decreased Gait velocity interpretation: <1.31 ft/sec, indicative of household ambulator   General Gait Details: Slow, mildly unsteady antalgic like gait with use of RW for support. Guarded and painful.  Stairs Stairs: Yes Stairs assistance: Supervision Stair Management: One rail Right, Step to pattern Number of Stairs: 4 General stair comments: Cues for technique/safety.  Wheelchair Mobility    Modified Rankin (Stroke Patients Only)       Balance Overall balance assessment: Needs assistance Sitting-balance support: Feet supported, No upper extremity supported Sitting balance-Leahy Scale: Good     Standing balance support: During functional activity Standing balance-Leahy Scale: Poor Standing balance comment: Requires UE support in standing                             Pertinent Vitals/Pain Pain Assessment Pain Assessment: Faces Faces Pain Scale: Hurts even more Pain  Location: back, LLE Pain Descriptors / Indicators: Sore, Operative site guarding, Guarding, Grimacing Pain Intervention(s): Monitored during session, Repositioned, Limited activity within patient's tolerance    Home Living Family/patient expects to be discharged to:: Private residence Living Arrangements: Spouse/significant other Available Help at Discharge: Family;Available PRN/intermittently Type of Home: House Home Access: Stairs to enter Entrance Stairs-Rails: Right Entrance Stairs-Number of Steps: 4-5   Home Layout: One level Home Equipment: Cane - quad      Prior Function Prior Level of Function : Independent/Modified Independent             Mobility Comments: Uses quad cane for ambulation for the last 2 months. Has not been working for the last 6 months. ADLs Comments: independent     Hand Dominance        Extremity/Trunk Assessment   Upper Extremity Assessment Upper Extremity Assessment: Defer to OT evaluation    Lower Extremity Assessment Lower Extremity Assessment: LLE deficits/detail;Generalized weakness LLE Deficits / Details: No sensation from hips down to foot. Quad weakness. LLE Sensation: decreased light touch;decreased proprioception LLE Coordination: decreased fine motor;decreased gross motor    Cervical / Trunk Assessment Cervical / Trunk Assessment: Back Surgery  Communication   Communication: No difficulties  Cognition Arousal/Alertness: Awake/alert Behavior During Therapy: WFL for tasks assessed/performed Overall Cognitive Status: Within Functional Limits for tasks assessed                                          General Comments General comments (skin integrity, edema, etc.): Wife present during session.    Exercises     Assessment/Plan    PT Assessment Patient does not need any further PT services  PT Problem List         PT Treatment Interventions      PT Goals (Current goals can be found in the Care Plan  section)  Acute Rehab PT Goals Patient Stated Goal: to go home, get back to work PT Goal Formulation: All assessment and education complete, DC therapy    Frequency       Co-evaluation               AM-PAC PT "6 Clicks" Mobility  Outcome Measure Help needed turning from your back to your side while in a flat bed without using bedrails?: None Help needed moving from lying on your back to sitting on the side of a flat bed without using bedrails?: None Help needed moving to and from a bed to a chair (including a wheelchair)?: A Little Help needed standing up from a chair using your arms (e.g., wheelchair or bedside chair)?: A Little Help needed to walk in hospital room?: A Little Help needed climbing 3-5 steps with a railing? : A Little 6 Click Score: 20    End of Session Equipment Utilized During Treatment: Gait belt Activity Tolerance: Patient tolerated treatment well;Patient limited by pain Patient left: in bed;with call bell/phone within reach;with family/visitor present Nurse Communication: Mobility status PT Visit Diagnosis: Pain;Unsteadiness on feet (R26.81);Difficulty in walking, not elsewhere classified (R26.2);Muscle weakness (generalized) (M62.81) Pain - Right/Left: Left Pain - part of body: Leg (back)    Time: 0600-4599 PT Time Calculation (min) (ACUTE ONLY): 20 min   Charges:   PT Evaluation $PT Eval Low Complexity: 1 Low          Jenisse Vullo H, PT, DPT Acute Rehabilitation Services Secure chat preferred Office  [phone]     Blake DivineShauna A Lyrick Worland 07/20/2022, 8:34 AM

## 2022-07-20 NOTE — Progress Notes (Signed)
Subjective: 1 Day Post-Op Procedure(s) (LRB): Microdiscectomy L4-5 left (Left) Patient reports pain as mild.  Reports incisional back pain. Shooting leg pain has resolved, numbness and weakness from pre-op remains. Voiding without difficulty. No N/V. Feels ready to go home.  Objective: Vital signs in last 24 hours: Temp:  [96.9 F (36.1 C)-98.8 F (37.1 C)] 98.2 F (36.8 C) (04/05 0718) Pulse Rate:  [70-94] 94 (04/05 0718) Resp:  [15-20] 20 (04/05 0718) BP: (122-150)/(68-93) 140/68 (04/05 0718) SpO2:  [93 %-99 %] 93 % (04/05 0718)  Intake/Output from previous day: 04/04 0701 - 04/05 0700 In: 1920 [P.O.:720; I.V.:1000; IV Piggyback:200] Out: 50 [Blood:50] Intake/Output this shift: No intake/output data recorded.  No results for input(s): "HGB" in the last 72 hours. No results for input(s): "WBC", "RBC", "HCT", "PLT" in the last 72 hours. No results for input(s): "NA", "K", "CL", "CO2", "BUN", "CREATININE", "GLUCOSE", "CALCIUM" in the last 72 hours. No results for input(s): "LABPT", "INR" in the last 72 hours.  Neurologically intact ABD soft Neurovascular intact Sensation intact distally Intact pulses distally Dorsiflexion/Plantar flexion intact Incision: dressing C/D/I and no drainage No cellulitis present Compartment soft No sign of DVT   Assessment/Plan: 1 Day Post-Op Procedure(s) (LRB): Microdiscectomy L4-5 left (Left) Advance diet Up with therapy D/C IV fluids Appreciate hospitalist assistance and recommendations, glipizide added. Will need follow up w PCP for glycemic control Discussed D/C instructions, Lspine precautions, dressing instructions Numbness and weakness should improve with time D/C to home today Discussed w Dr Tenna Delaine Doralee Albino 07/20/2022, 7:39 AM

## 2023-01-29 NOTE — Telephone Encounter (Signed)
TC
